# Patient Record
Sex: Female | Born: 2000 | Race: Black or African American | Hispanic: No | Smoking: Never smoker
Health system: Southern US, Community
[De-identification: ages and names within clinical notes are randomized; demographics above are authoritative.]

## PROBLEM LIST (undated history)

## (undated) DIAGNOSIS — Z789 Other specified health status: Secondary | ICD-10-CM

## (undated) DIAGNOSIS — D649 Anemia, unspecified: Secondary | ICD-10-CM

## (undated) HISTORY — DX: Anemia, unspecified: D64.9

## (undated) HISTORY — DX: Other specified health status: Z78.9

## (undated) HISTORY — PX: NO PAST SURGERIES: SHX2092

---
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
## 2019-03-11 IMAGING — CR DG CHEST 2V
2 series · 2 of 2 positions shown · non-contrast
Comparison: None.

CLINICAL DATA: Acute onset of intermittent generalized chest pain
and shortness of breath. Initial encounter.

EXAM:
CHEST  2 VIEW

[chest pa]
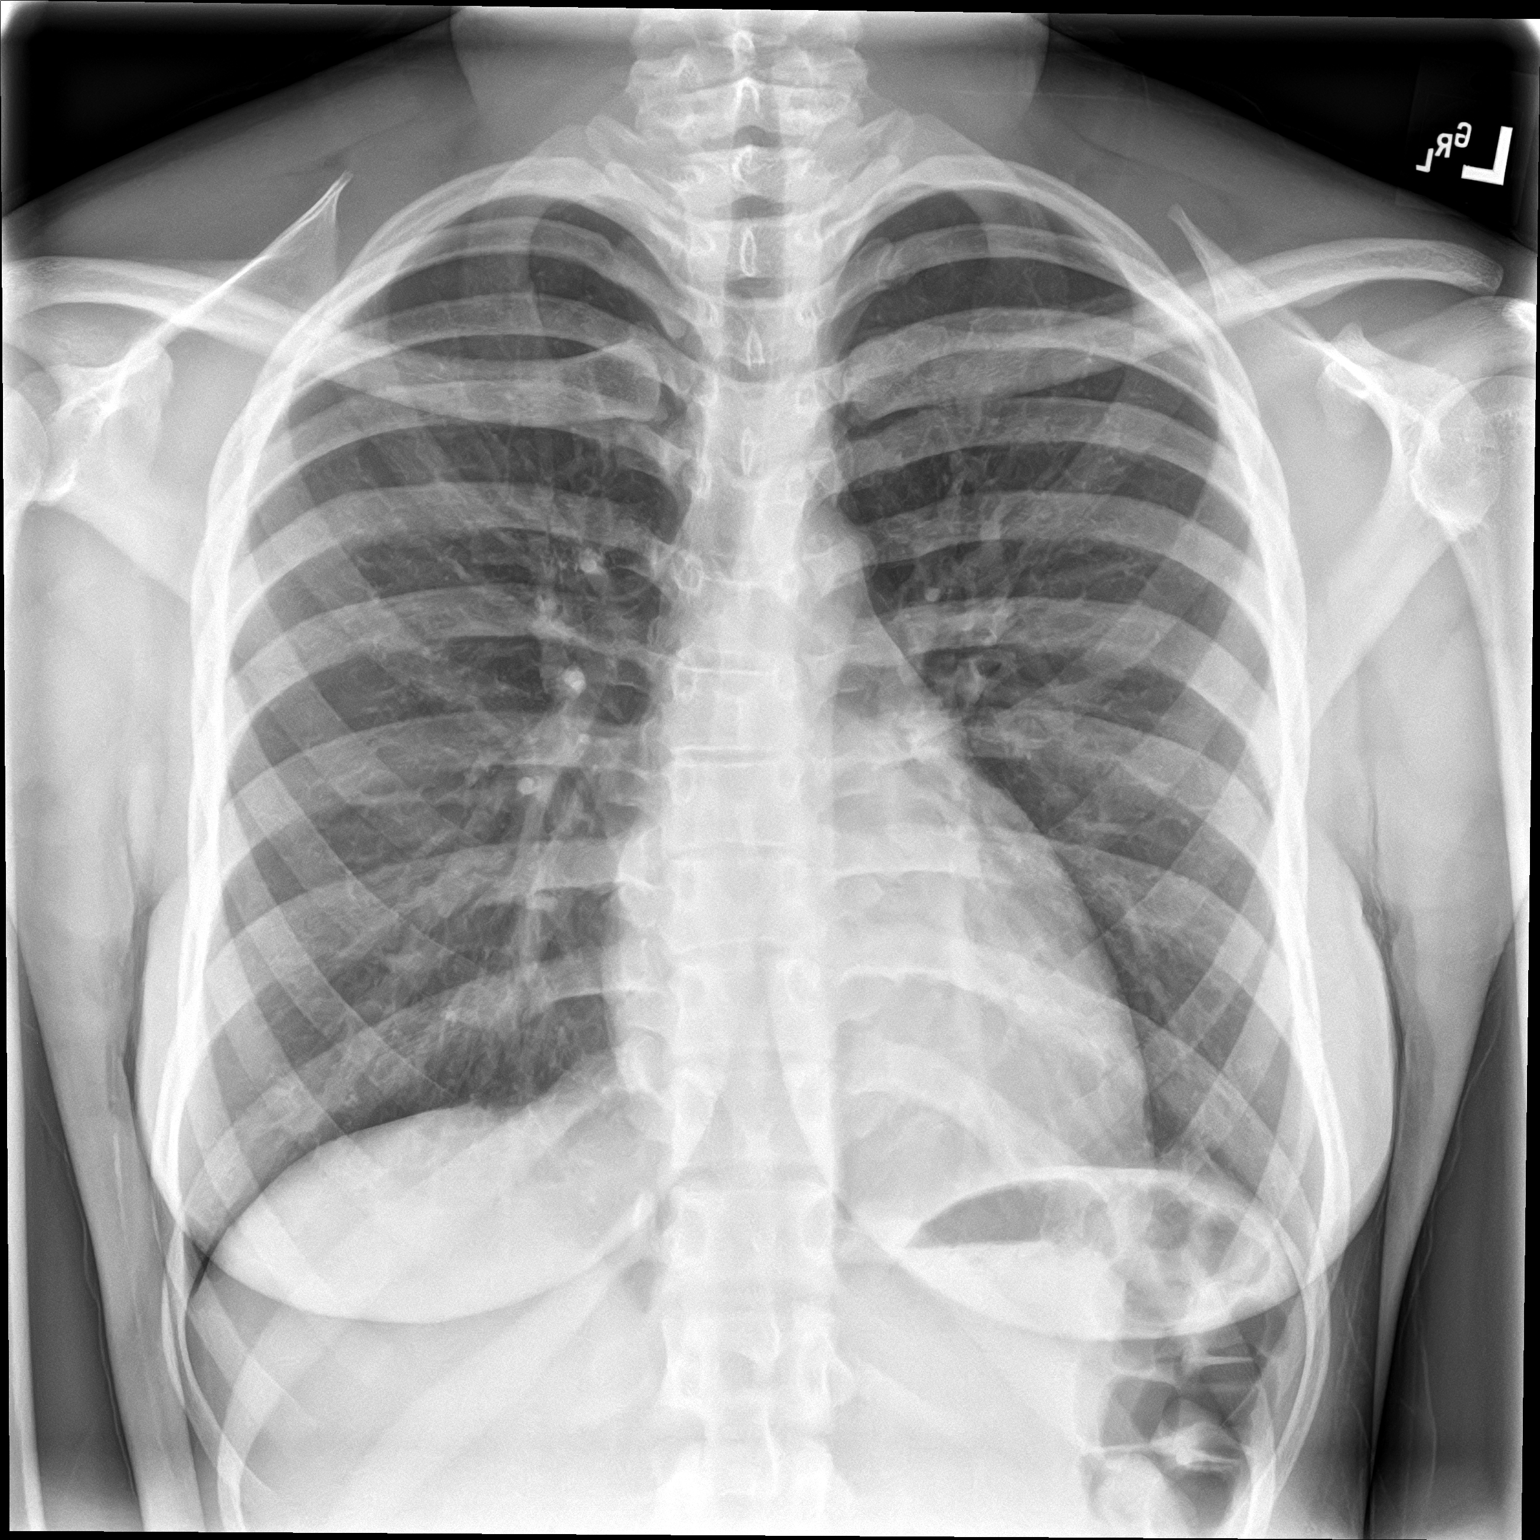

[chest lat]
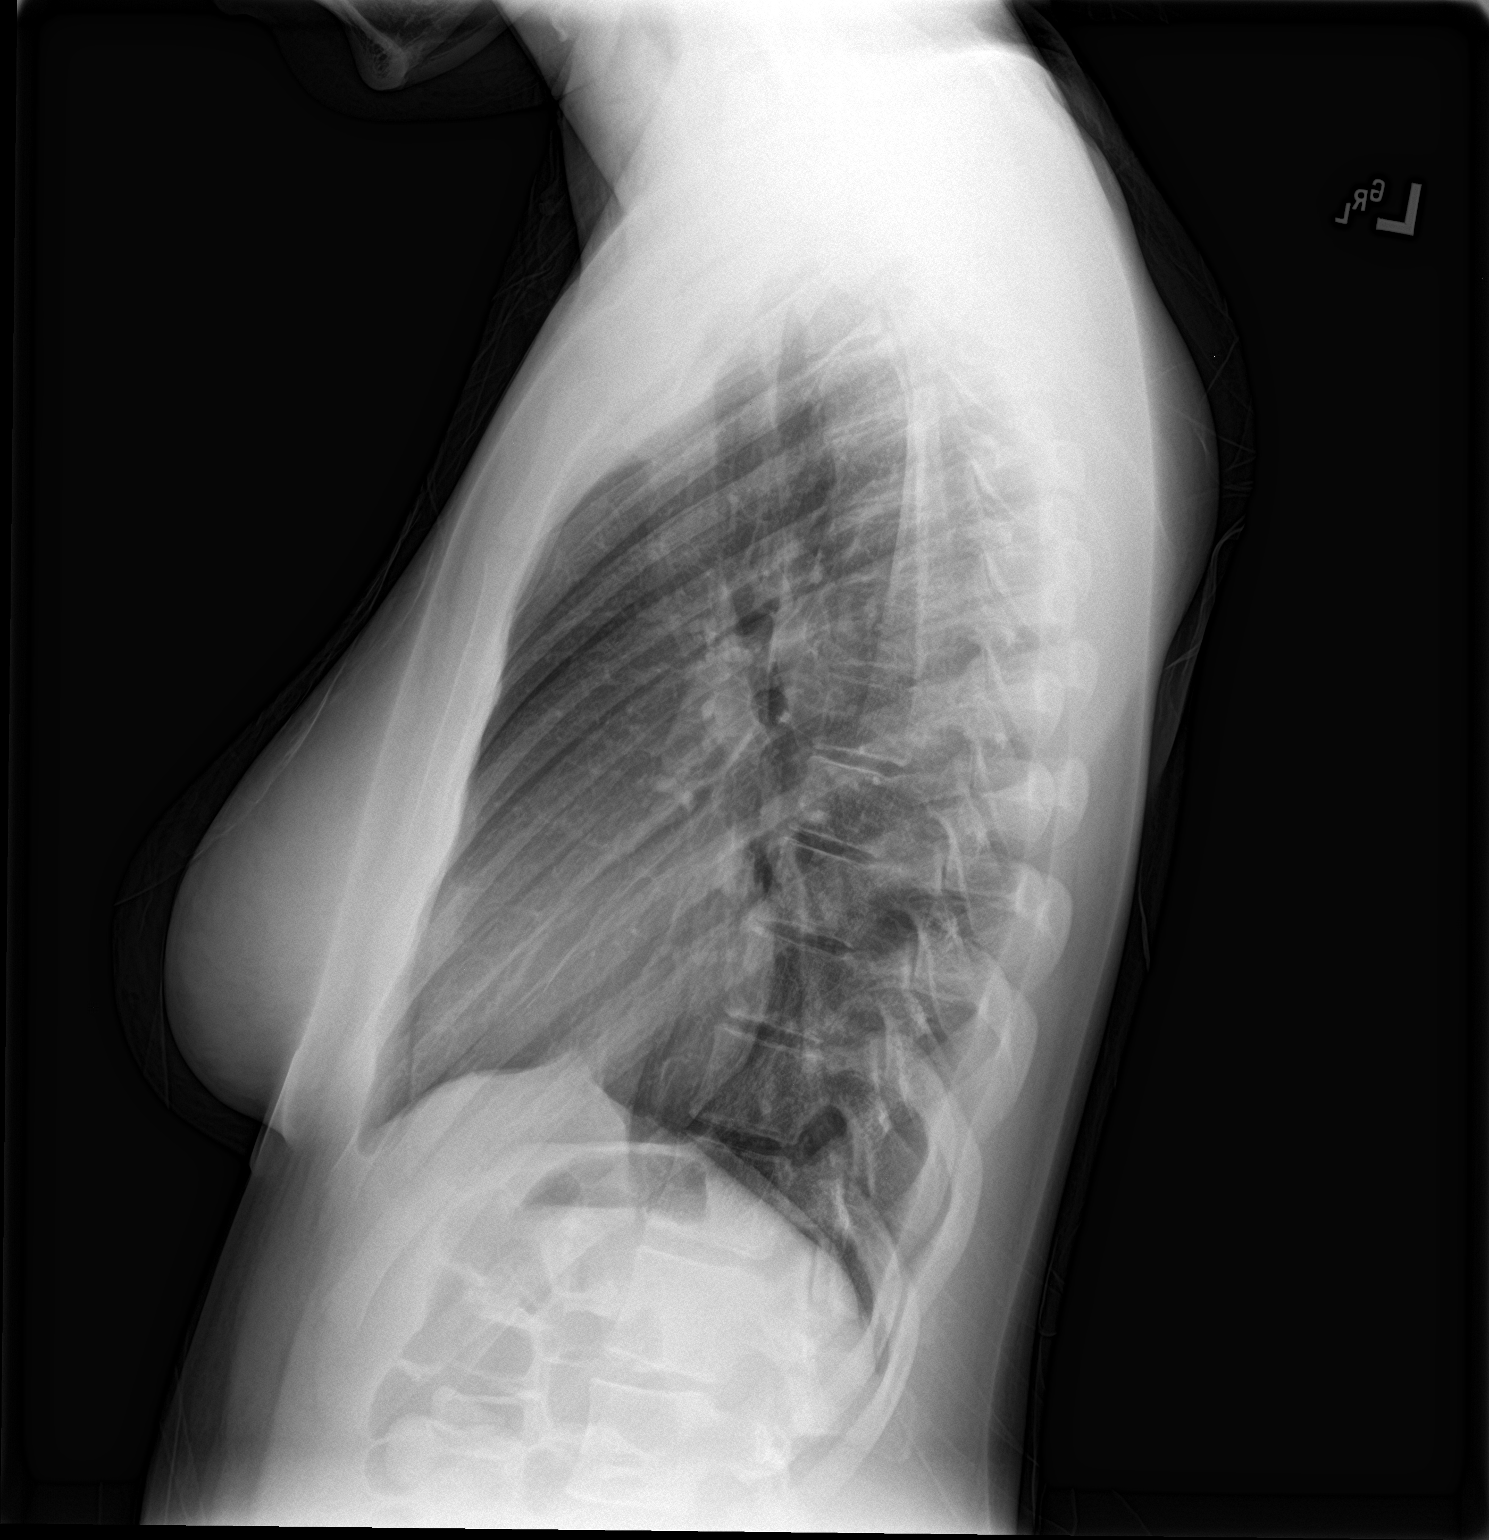

[2 of 2 positions shown; findings below may reference images not displayed]

FINDINGS: The lungs are well-aerated and clear. There is no evidence of focal
opacification, pleural effusion or pneumothorax.

The heart is normal in size; the mediastinal contour is within
normal limits. No acute osseous abnormalities are seen.
IMPRESSION: No acute cardiopulmonary process seen.

## 2019-03-20 ENCOUNTER — Other Ambulatory Visit
Admission: RE | Admit: 2019-03-20 | Discharge: 2019-03-20 | Disposition: A | Discharge status: Home / Self Care | Payer: Medicaid Other | Source: Ambulatory Visit | Attending: Family Medicine | Admitting: Family Medicine

## 2019-03-20 DIAGNOSIS — D649 Anemia, unspecified: Secondary | ICD-10-CM | POA: Diagnosis not present

## 2019-03-20 LAB — CBC
HCT: 30.3 % — ABNORMAL LOW (ref 36.0–46.0)
Hemoglobin: 9.1 g/dL — ABNORMAL LOW (ref 12.0–15.0)
MCH: 24.7 pg — ABNORMAL LOW (ref 26.0–34.0)
MCHC: 30 g/dL (ref 30.0–36.0)
MCV: 82.1 fL (ref 80.0–100.0)
Platelets: 293 10*3/uL (ref 150–400)
RBC: 3.69 MIL/uL — ABNORMAL LOW (ref 3.87–5.11)
RDW: 15.4 % (ref 11.5–15.5)
WBC: 4.9 10*3/uL (ref 4.0–10.5)
nRBC: 0 % (ref 0.0–0.2)

## 2019-03-20 LAB — IRON AND TIBC
Iron: 13 ug/dL — ABNORMAL LOW (ref 28–170)
Saturation Ratios: 3 % — ABNORMAL LOW (ref 10.4–31.8)
TIBC: 439 ug/dL (ref 250–450)
UIBC: 426 ug/dL

## 2020-03-24 NOTE — Patient Instructions (Signed)
I value your feedback and entrusting us with your care. If you get a [facility] patient survey, I would appreciate you taking the time to let us know about your experience today. Thank you!  As of November 08, 2019, your lab results will be released to your MyChart immediately, before I even have a chance to see them. Please give me time to review them and contact you if there are any abnormalities. Thank you for your patience.  

## 2020-03-24 NOTE — Progress Notes (Signed)
PCP:  Mathews Robinsons, MD   Chief Complaint  Patient presents with  . Gynecologic Exam     HPI:      [name] is a 19 y.o. G0P0000 who LMP was Patient's last menstrual period was 02/29/2020 (exact date)., presents today for her annual examination.  Her menses are regular every 28-30 days, lasting 5-7 days.  Dysmenorrhea mild, occurring first 1-2 days of flow. She does not have intermenstrual bleeding.  Sex activity: not current active, has been in past. Did xulane in past. Declines BC for now. Last Pap: N/A due to age Hx of STDs: none; would like full STD testing  There is no FH of breast cancer. There is no FH of ovarian cancer. The patient does not do self-breast exams.  Tobacco use: The patient denies current or previous tobacco use. Alcohol use: none No drug use.  Exercise: moderately active  She does not get adequate calcium and Vitamin D in her diet. Gardasil not done  History reviewed. No pertinent past medical history.  History reviewed. No pertinent surgical history.  Family History  Problem Relation Age of Onset  . Cancer Maternal Grandfather   . Breast cancer Neg Hx   . Ovarian cancer Neg Hx     Social History   Socioeconomic History  . Marital status: Single    Spouse name: Not on file  . Number of children: Not on file  . Years of education: Not on file  . Highest education level: Not on file  Occupational History  . Not on file  Tobacco Use  . Smoking status: Never Smoker  . Smokeless tobacco: Never Used  Substance and Sexual Activity  . Alcohol use: No  . Drug use: No  . Sexual activity: Yes    Birth control/protection: None  Other Topics Concern  . Not on file  Social History Narrative  . Not on file   Social Determinants of Health   Financial Resource Strain:   . Difficulty of Paying Living Expenses:   Food Insecurity:   . Worried About Charity fundraiser in the Last Year:   . Arboriculturist in the Last Year:    Transportation Needs:   . Film/video editor (Medical):   Marland Kitchen Lack of Transportation (Non-Medical):   Physical Activity:   . Days of Exercise per Week:   . Minutes of Exercise per Session:   Stress:   . Feeling of Stress :   Social Connections:   . Frequency of Communication with Friends and Family:   . Frequency of Social Gatherings with Friends and Family:   . Attends Religious Services:   . Active Member of Clubs or Organizations:   . Attends Archivist Meetings:   Marland Kitchen Marital Status:   Intimate Partner Violence:   . Fear of Current or Ex-Partner:   . Emotionally Abused:   Marland Kitchen Physically Abused:   . Sexually Abused:     No current outpatient medications on file.     ROS:  Review of Systems  Constitutional: Negative for fatigue, fever and unexpected weight change.  Respiratory: Negative for cough, shortness of breath and wheezing.   Cardiovascular: Negative for chest pain, palpitations and leg swelling.  Gastrointestinal: Negative for blood in stool, constipation, diarrhea, nausea and vomiting.  Endocrine: Negative for cold intolerance, heat intolerance and polyuria.  Genitourinary: Negative for dyspareunia, dysuria, flank pain, frequency, genital sores, hematuria, menstrual problem, pelvic pain, urgency, vaginal bleeding, vaginal discharge and  vaginal pain.  Musculoskeletal: Negative for back pain, joint swelling and myalgias.  Skin: Negative for rash.  Neurological: Negative for dizziness, syncope, light-headedness, numbness and headaches.  Hematological: Negative for adenopathy.  Psychiatric/Behavioral: Negative for agitation, confusion, sleep disturbance and suicidal ideas. The patient is not nervous/anxious.   BREAST: No symptoms   Objective: BP 110/70   Ht 5\' 3"  (1.6 m)   Wt 143 lb (64.9 kg)   LMP 02/29/2020 (Exact Date)   BMI 25.33 kg/m    Physical Exam Constitutional:      Appearance: She is well-developed.  Genitourinary:     Vulva,  vagina, cervix, uterus, right adnexa and left adnexa normal.     No vulval lesion or tenderness noted.     No vaginal discharge, erythema or tenderness.     No cervical polyp.     Uterus is not enlarged or tender.     No right or left adnexal mass present.     Right adnexa not tender.     Left adnexa not tender.  Neck:     Thyroid: No thyromegaly.  Cardiovascular:     Rate and Rhythm: Normal rate and regular rhythm.     Heart sounds: Normal heart sounds. No murmur.  Pulmonary:     Effort: Pulmonary effort is normal.     Breath sounds: Normal breath sounds.  Chest:     Breasts:        Right: No mass, nipple discharge, skin change or tenderness.        Left: No mass, nipple discharge, skin change or tenderness.  Abdominal:     Palpations: Abdomen is soft.     Tenderness: There is no abdominal tenderness. There is no guarding.  Musculoskeletal:        General: Normal range of motion.     Cervical back: Normal range of motion.  Neurological:     General: No focal deficit present.     Mental Status: She is alert and oriented to person, place, and time.     Cranial Nerves: No cranial nerve deficit.  Skin:    General: Skin is warm and dry.  Psychiatric:        Mood and Affect: Mood normal.        Behavior: Behavior normal.        Thought Content: Thought content normal.        Judgment: Judgment normal.  Vitals reviewed.    Assessment/Plan: Encounter for annual routine gynecological examination  Screening for STD (sexually transmitted disease) - Plan: Cervicovaginal ancillary only, HIV Antibody (routine testing w rflx), RPR, HSV 2 antibody, IgG, Hepatitis C antibody            GYN counsel adequate intake of calcium and vitamin D, diet and exercise; Gardasil--f/u with PCP due to insurance     F/U  Return in about 1 year (around 03/25/2021).  Kaia Depaolis B. Awab Abebe, PA-C 03/25/2020 8:39 AM

## 2020-03-25 ENCOUNTER — Other Ambulatory Visit: Payer: Self-pay

## 2020-03-25 ENCOUNTER — Encounter: Payer: Self-pay | Admitting: Obstetrics and Gynecology

## 2020-03-25 ENCOUNTER — Other Ambulatory Visit (HOSPITAL_COMMUNITY)
Admission: RE | Admit: 2020-03-25 | Discharge: 2020-03-25 | Disposition: A | Discharge status: Home / Self Care | Payer: Medicaid Other | Source: Ambulatory Visit | Attending: Obstetrics and Gynecology | Admitting: Obstetrics and Gynecology

## 2020-03-25 ENCOUNTER — Ambulatory Visit (INDEPENDENT_AMBULATORY_CARE_PROVIDER_SITE_OTHER): Payer: Medicaid Other | Admitting: Obstetrics and Gynecology

## 2020-03-25 VITALS — BP 110/70 | Ht 63.0 in | Wt 143.0 lb

## 2020-03-25 DIAGNOSIS — Z113 Encounter for screening for infections with a predominantly sexual mode of transmission: Secondary | ICD-10-CM | POA: Diagnosis present

## 2020-03-25 DIAGNOSIS — Z01419 Encounter for gynecological examination (general) (routine) without abnormal findings: Secondary | ICD-10-CM | POA: Diagnosis not present

## 2020-03-25 DIAGNOSIS — Z0000 Encounter for general adult medical examination without abnormal findings: Secondary | ICD-10-CM

## 2020-03-26 LAB — CERVICOVAGINAL ANCILLARY ONLY
Chlamydia: NEGATIVE
Comment: NEGATIVE
Comment: NORMAL
Neisseria Gonorrhea: NEGATIVE

## 2020-03-26 LAB — RPR: RPR Ser Ql: NONREACTIVE

## 2020-03-26 LAB — HEPATITIS C ANTIBODY: Hep C Virus Ab: <0.1 s/co ratio (ref 0.0–0.9)

## 2020-03-26 LAB — HIV ANTIBODY (ROUTINE TESTING W REFLEX): HIV Screen 4th Generation wRfx: NONREACTIVE

## 2020-03-26 LAB — HSV 2 ANTIBODY, IGG: HSV 2 IgG, Type Spec: <0.91 index (ref 0.00–0.90)

## 2020-06-07 ENCOUNTER — Emergency Department
Admission: EM | Admit: 2020-06-07 | Discharge: 2020-06-07 | Disposition: A | Discharge status: Home / Self Care | Payer: Medicaid Other | Attending: Emergency Medicine | Admitting: Emergency Medicine

## 2020-06-07 ENCOUNTER — Other Ambulatory Visit: Payer: Self-pay

## 2020-06-07 ENCOUNTER — Encounter: Payer: Self-pay | Admitting: Emergency Medicine

## 2020-06-07 DIAGNOSIS — N762 Acute vulvitis: Secondary | ICD-10-CM | POA: Diagnosis not present

## 2020-06-07 DIAGNOSIS — N760 Acute vaginitis: Secondary | ICD-10-CM

## 2020-06-07 DIAGNOSIS — N764 Abscess of vulva: Secondary | ICD-10-CM | POA: Diagnosis present

## 2020-06-07 MED ORDER — SULFAMETHOXAZOLE-TRIMETHOPRIM 800-160 MG PO TABS: 1.0000 | ORAL_TABLET | Freq: Two times a day (BID) | ORAL | 0 refills | Status: DC

## 2020-06-07 MED ORDER — HYDROCODONE-ACETAMINOPHEN 5-325 MG PO TABS: 1.0000 | ORAL_TABLET | Freq: Four times a day (QID) | ORAL | 0 refills | Status: DC | PRN

## 2020-06-07 NOTE — ED Provider Notes (Signed)
[facility] Emergency Department Provider Note   ____________________________________________   First MD Initiated Contact with Patient 06/07/20 1226     (approximate)  I have reviewed the triage vital signs and the nursing notes.   HISTORY  Chief Complaint Abscess    HPI Kristi Herrera is a 19 y.o. female presents to the ED with possible abscess to the vaginal area.  Patient states that she noticed some swelling to her labia couple days ago.  She denies any vaginal discharge or urinary symptoms.  Patient does use a razor to shave in this area but denies any skin injuries.  Currently she rates her pain as 7 out of 10.      History reviewed. No pertinent past medical history.  There are no problems to display for this patient.   History reviewed. No pertinent surgical history.  Prior to Admission medications   Medication Sig Start Date End Date Taking? Authorizing Provider  HYDROcodone-acetaminophen (NORCO/VICODIN) 5-325 MG tablet Take 1 tablet by mouth every 6 (six) hours as needed for moderate pain. 06/07/20   Tommi Rumps, PA-C  sulfamethoxazole-trimethoprim (BACTRIM DS) 800-160 MG tablet Take 1 tablet by mouth 2 (two) times daily. 06/07/20   Tommi Rumps, PA-C    Allergies Patient has no known allergies.  Family History  Problem Relation Age of Onset  . Cancer Maternal Grandfather   . Breast cancer Neg Hx   . Ovarian cancer Neg Hx     Social History Social History   Tobacco Use  . Smoking status: Never Smoker  . Smokeless tobacco: Never Used  Vaping Use  . Vaping Use: Never used  Substance Use Topics  . Alcohol use: No  . Drug use: Not on file    Review of Systems Constitutional: No fever/chills Cardiovascular: Denies chest pain. Respiratory: Denies shortness of breath. Gastrointestinal: No abdominal pain.  No nausea, no vomiting.  Genitourinary: Negative for dysuria.  Positive possible vaginal  abscess. Musculoskeletal: Negative for back pain. Skin: Negative for rash. Neurological: Negative for headaches, focal weakness or numbness. ____________________________________________   PHYSICAL EXAM:  VITAL SIGNS: ED Triage Vitals  Enc Vitals Group     BP 06/07/20 1221 128/72     Pulse Rate 06/07/20 1221 67     Resp 06/07/20 1221 18     Temp 06/07/20 1221 97.6 F (36.4 C)     Temp Source 06/07/20 1221 Oral     SpO2 06/07/20 1221 100 %     Weight 06/07/20 1222 140 lb (63.5 kg)     Height 06/07/20 1222 5\' 3"  (1.6 m)     Head Circumference --      Peak Flow --      Pain Score 06/07/20 1222 7     Pain Loc --      Pain Edu? --      Excl. in GC? --     Constitutional: Alert and oriented. Well appearing and in no acute distress. Eyes: Conjunctivae are normal.  Head: Atraumatic. Neck: No stridor.   Cardiovascular: Normal rate, regular rhythm. Grossly normal heart sounds.  Good peripheral circulation. Respiratory: Normal respiratory effort.  No retractions. Lungs CTAB. Gastrointestinal: Soft and nontender. No distention. Genitourinary: Examination of vaginal area does shave in this area but no cuts or abrasions were noted.  There is on the labia majora left side distally a area of mild erythema with no localized abscess at this time.  Area is tender to palpation.  No vaginal discharge noted.  Possible cellulitis secondary to shaving versus early abscess. Musculoskeletal: Patient is able to ambulate without any assistance. Neurologic:  Normal speech and language. No gross focal neurologic deficits are appreciated. No gait instability. Skin:  Skin is warm, dry and intact. No rash noted. Psychiatric: Mood and affect are normal. Speech and behavior are normal.  ____________________________________________   LABS (all labs ordered are listed, but only abnormal results are displayed)  Labs Reviewed - No data to display   PROCEDURES  Procedure(s) performed (including Critical  Care):  Procedures   ____________________________________________   INITIAL IMPRESSION / ASSESSMENT AND PLAN / ED COURSE  As part of my medical decision making, I reviewed the following data within the electronic MEDICAL RECORD NUMBER Notes from prior ED visits and [place] Controlled Substance Database  19 year old female presents to the ED with possible abscess to her vaginal area.  Patient states this started 2 days ago and is very tender.  She admits that she has been shaving in this area but is unaware of any cuts or abrasions.  Area on the left labia majora appears to be a cellulitis at this time with tenderness and no localized abscess.  Patient is instructed to begin using warm compresses or sitz bath's.  She is started on Bactrim DS twice daily for 10 days and hydrocodone as needed for pain.  She is aware to return to the emergency department if any worsening of her symptoms such as fever, chills, enlargement of this area.  ____________________________________________   FINAL CLINICAL IMPRESSION(S) / ED DIAGNOSES  Final diagnoses:  Labial infection     ED Discharge Orders         Ordered    sulfamethoxazole-trimethoprim (BACTRIM DS) 800-160 MG tablet  2 times daily     Discontinue  Reprint     06/07/20 1306    HYDROcodone-acetaminophen (NORCO/VICODIN) 5-325 MG tablet  Every 6 hours PRN     Discontinue  Reprint     06/07/20 1306           Note:  This document was prepared using Dragon voice recognition software and may include unintentional dictation errors.    Tommi Rumps, PA-C 06/07/20 1346    Arnaldo Natal, MD 06/07/20 1540

## 2020-06-07 NOTE — ED Triage Notes (Signed)
Presents with possible abscess to vaginal area  States she noticed some swelling to labia couple of days ago

## 2020-06-07 NOTE — Discharge Instructions (Signed)
Follow-up with your primary care provider if any continued problems.  If any worsening of your symptoms return to the emergency department.  At this time there is definite skin infection however there is no abscess to be drained.  Begin using warm moist compresses to the area frequently or sits baths.  This still can develop into an abscess and need to be opened.  Also discontinue using the razor that you have been using and began using a new one once this area has completely cleared.  A prescription for pain medication and antibiotics was sent to your pharmacy.  Take the antibiotic until completely finished.  Pain medication as needed for pain.  Do not drive or operate machinery while taking this medication.

## 2020-06-13 ENCOUNTER — Ambulatory Visit: Payer: Self-pay | Admitting: Obstetrics and Gynecology

## 2020-06-13 ENCOUNTER — Ambulatory Visit (INDEPENDENT_AMBULATORY_CARE_PROVIDER_SITE_OTHER): Payer: Medicaid Other | Admitting: Obstetrics and Gynecology

## 2020-06-13 ENCOUNTER — Other Ambulatory Visit (HOSPITAL_COMMUNITY)
Admission: RE | Admit: 2020-06-13 | Discharge: 2020-06-13 | Disposition: A | Discharge status: Home / Self Care | Payer: Medicaid Other | Source: Ambulatory Visit | Attending: Obstetrics and Gynecology | Admitting: Obstetrics and Gynecology

## 2020-06-13 ENCOUNTER — Encounter: Payer: Self-pay | Admitting: Obstetrics and Gynecology

## 2020-06-13 ENCOUNTER — Other Ambulatory Visit: Payer: Self-pay

## 2020-06-13 VITALS — BP 118/74 | Ht 63.0 in | Wt 142.0 lb

## 2020-06-13 DIAGNOSIS — N751 Abscess of Bartholin's gland: Secondary | ICD-10-CM

## 2020-06-13 DIAGNOSIS — Z113 Encounter for screening for infections with a predominantly sexual mode of transmission: Secondary | ICD-10-CM

## 2020-06-13 NOTE — Progress Notes (Signed)
Obstetrics & Gynecology Office Visit   Chief Complaint  Patient presents with  . Vaginal Pain  Follow up abscess  History of Present Illness: 19 y.o. G0P0000 female who presents to have a possible labial abscess assessed.  She noted pain on her left labial area about 6 days ago. She had difficulty sitting down. She is not sure what could have caused it. She was told she had cellulitis but no overt abscess in the ER. She has seen her pediatrician at [facility], who switched her from Bactrim, which she was given in the ER, to doxycycline.  She had some but not complete resolution of the pain with Bactrim. She started doxycycline yesterday.  She believes her pain is improved, but not gone. Denies history of STDs. She is with a new partner and is OK with STD testing.   Past Medical History:  Diagnosis Date  . No known health problems     Past Surgical History:  Procedure Laterality Date  . NO PAST SURGERIES      Gynecologic History: Patient's last menstrual period was 05/17/2020 (exact date).  Obstetric History: G0P0000  Family History  Problem Relation Age of Onset  . Cancer Maternal Grandfather   . Breast cancer Neg Hx   . Ovarian cancer Neg Hx     Social History   Socioeconomic History  . Marital status: Single    Spouse name: Not on file  . Number of children: Not on file  . Years of education: Not on file  . Highest education level: Not on file  Occupational History  . Not on file  Tobacco Use  . Smoking status: Never Smoker  . Smokeless tobacco: Never Used  Vaping Use  . Vaping Use: Never used  Substance and Sexual Activity  . Alcohol use: No  . Drug use: Not on file  . Sexual activity: Yes    Birth control/protection: None  Other Topics Concern  . Not on file  Social History Narrative  . Not on file   Social Determinants of Health   Financial Resource Strain:   . Difficulty of Paying Living Expenses:   Food Insecurity:   . Worried About Community education officer in the Last Year:   . Barista in the Last Year:   Transportation Needs:   . Freight forwarder (Medical):   Marland Kitchen Lack of Transportation (Non-Medical):   Physical Activity:   . Days of Exercise per Week:   . Minutes of Exercise per Session:   Stress:   . Feeling of Stress :   Social Connections:   . Frequency of Communication with Friends and Family:   . Frequency of Social Gatherings with Friends and Family:   . Attends Religious Services:   . Active Member of Clubs or Organizations:   . Attends Banker Meetings:   Marland Kitchen Marital Status:   Intimate Partner Violence:   . Fear of Current or Ex-Partner:   . Emotionally Abused:   Marland Kitchen Physically Abused:   . Sexually Abused:     No Known Allergies  Prior to Admission medications   Medication Sig Start Date End Date Taking? Authorizing Provider  HYDROcodone-acetaminophen (NORCO/VICODIN) 5-325 MG tablet Take 1 tablet by mouth every 6 (six) hours as needed for moderate pain. 06/07/20   Tommi Rumps, PA-C  Doxycycline 100 mg po BID x 14 days  Review of Systems  Constitutional: Negative.   HENT: Negative.   Eyes: Negative.   Respiratory:  Negative.   Cardiovascular: Negative.   Gastrointestinal: Negative.   Genitourinary: Negative.   Musculoskeletal: Negative.   Skin: Negative.   Neurological: Negative.   Psychiatric/Behavioral: Negative.      Physical Exam BP 118/74   Ht 5\' 3"  (1.6 m)   Wt 142 lb (64.4 kg)   LMP 05/17/2020 (Exact Date)   BMI 25.15 kg/m  Patient's last menstrual period was 05/17/2020 (exact date). Physical Exam Constitutional:      General: She is not in acute distress.    Appearance: Normal appearance. She is well-developed.  Genitourinary:     Pelvic exam was performed with patient in the lithotomy position.     Inguinal canal, urethra and bladder normal.     Vulval lesion, tenderness and Bartholin's cyst (left abscess 3x3x2.5 cm) present.     HENT:     Head:  Normocephalic and atraumatic.  Eyes:     General: No scleral icterus.    Conjunctiva/sclera: Conjunctivae normal.  Cardiovascular:     Rate and Rhythm: Normal rate and regular rhythm.     Heart sounds: No murmur heard.  No friction rub. No gallop.   Pulmonary:     Effort: Pulmonary effort is normal. No respiratory distress.     Breath sounds: Normal breath sounds. No wheezing or rales.  Abdominal:     General: Bowel sounds are normal. There is no distension.     Palpations: Abdomen is soft. There is no mass.     Tenderness: There is no abdominal tenderness. There is no guarding or rebound.  Musculoskeletal:        General: Normal range of motion.     Cervical back: Normal range of motion and neck supple.  Neurological:     General: No focal deficit present.     Mental Status: She is alert and oriented to person, place, and time.     Cranial Nerves: No cranial nerve deficit.  Skin:    General: Skin is warm and dry.     Findings: No erythema.  Psychiatric:        Mood and Affect: Mood normal.        Behavior: Behavior normal.        Judgment: Judgment normal.     Female chaperone present for pelvic and breast  portions of the physical exam  Assessment: 19 y.o. G0P0000 female here for  1. Bartholin's gland abscess   2. Screen for STD (sexually transmitted disease)      Plan: Problem List Items Addressed This Visit    None    Visit Diagnoses    Bartholin's gland abscess    -  Primary   Relevant Orders   Cervicovaginal ancillary only   Screen for STD (sexually transmitted disease)       Relevant Orders   Cervicovaginal ancillary only     We discussed the natural history of Bartholin's gland abscesses.  Treatment options include monitoring to see if opens and drains without intervention.  I would recommend continuation of antibiotics in this case.  Bactrim is a typical good first-line antibiotic for this abscess.  However, she only benefited minimally from Bactrim.  I  believe doxycycline would be fine for 2-week course at this point.  Other options offered were an incision and drainage of the abscess, incision and drainage with a Ward catheter, marsupialization in the operating room.  She elects to see how she responds to the antibiotics and if she still has the lesion present in a couple  of weeks she will call to schedule a marsupialization.  A total of 20 minutes were spent face-to-face with the patient as well as preparation, review, communication, and documentation during this encounter.    Thomasene Mohair, MD 06/13/2020 5:20 PM

## 2020-06-18 LAB — CERVICOVAGINAL ANCILLARY ONLY
Chlamydia: NEGATIVE
Comment: NEGATIVE
Comment: NEGATIVE
Comment: NORMAL
Neisseria Gonorrhea: NEGATIVE
Trichomonas: NEGATIVE

## 2020-06-19 ENCOUNTER — Telehealth: Payer: Self-pay

## 2020-06-19 NOTE — Telephone Encounter (Signed)
Please let patient know that we can possibly get her in for I&D or for marsupialization next week.  Just let her know I'm not around next week.

## 2020-06-19 NOTE — Telephone Encounter (Signed)
Pt calling to let SD[facility] know her abscess is not getting better.  [phone]

## 2020-06-20 ENCOUNTER — Other Ambulatory Visit: Payer: Self-pay

## 2020-06-20 ENCOUNTER — Emergency Department
Admission: EM | Admit: 2020-06-20 | Discharge: 2020-06-20 | Disposition: A | Discharge status: Home / Self Care | Payer: Medicaid Other | Attending: Emergency Medicine | Admitting: Emergency Medicine

## 2020-06-20 DIAGNOSIS — N751 Abscess of Bartholin's gland: Secondary | ICD-10-CM | POA: Insufficient documentation

## 2020-06-20 LAB — CBC WITH DIFFERENTIAL/PLATELET
Abs Immature Granulocytes: 0.1 10*3/uL — ABNORMAL HIGH (ref 0.00–0.07)
Basophils Absolute: 0 10*3/uL (ref 0.0–0.1)
Basophils Relative: 0 %
Eosinophils Absolute: 0.1 10*3/uL (ref 0.0–0.5)
Eosinophils Relative: 1 %
HCT: 31.1 % — ABNORMAL LOW (ref 36.0–46.0)
Hemoglobin: 10 g/dL — ABNORMAL LOW (ref 12.0–15.0)
Immature Granulocytes: 1 %
Lymphocytes Relative: 19 %
Lymphs Abs: 1.9 10*3/uL (ref 0.7–4.0)
MCH: 26.2 pg (ref 26.0–34.0)
MCHC: 32.2 g/dL (ref 30.0–36.0)
MCV: 81.4 fL (ref 80.0–100.0)
Monocytes Absolute: 0.6 10*3/uL (ref 0.1–1.0)
Monocytes Relative: 6 %
Neutro Abs: 7.3 10*3/uL (ref 1.7–7.7)
Neutrophils Relative %: 73 %
Platelets: 370 10*3/uL (ref 150–400)
RBC: 3.82 MIL/uL — ABNORMAL LOW (ref 3.87–5.11)
RDW: 15.4 % (ref 11.5–15.5)
WBC: 10.1 10*3/uL (ref 4.0–10.5)
nRBC: 0 % (ref 0.0–0.2)

## 2020-06-20 LAB — URINALYSIS, COMPLETE (UACMP) WITH MICROSCOPIC
Bilirubin Urine: NEGATIVE
Glucose, UA: NEGATIVE mg/dL
Hgb urine dipstick: NEGATIVE
Ketones, ur: NEGATIVE mg/dL
Leukocytes,Ua: NEGATIVE
Nitrite: NEGATIVE
Protein, ur: NEGATIVE mg/dL
Specific Gravity, Urine: 1.021 (ref 1.005–1.030)
pH: 7 (ref 5.0–8.0)

## 2020-06-20 LAB — COMPREHENSIVE METABOLIC PANEL
ALT: 9 U/L (ref 0–44)
AST: 17 U/L (ref 15–41)
Albumin: 3.9 g/dL (ref 3.5–5.0)
Alkaline Phosphatase: 51 U/L (ref 38–126)
Anion gap: 8 (ref 5–15)
BUN: 15 mg/dL (ref 6–20)
CO2: 24 mmol/L (ref 22–32)
Calcium: 8.5 mg/dL — ABNORMAL LOW (ref 8.9–10.3)
Chloride: 107 mmol/L (ref 98–111)
Creatinine, Ser: 1.04 mg/dL — ABNORMAL HIGH (ref 0.44–1.00)
GFR calc Af Amer: >60 mL/min (ref >60)
GFR calc non Af Amer: >60 mL/min (ref >60)
Glucose, Bld: 98 mg/dL (ref 70–99)
Potassium: 3.9 mmol/L (ref 3.5–5.1)
Sodium: 139 mmol/L (ref 135–145)
Total Bilirubin: 0.6 mg/dL (ref 0.3–1.2)
Total Protein: 7.6 g/dL (ref 6.5–8.1)

## 2020-06-20 LAB — PREGNANCY, URINE: Preg Test, Ur: NEGATIVE

## 2020-06-20 MED ORDER — ONDANSETRON HCL 4 MG/2ML IJ SOLN
4.0000 mg | Freq: Once | INTRAMUSCULAR | Status: AC
  Administered 2020-06-20: 4 mg via INTRAVENOUS
  Filled 2020-06-20: qty 2

## 2020-06-20 MED ORDER — HYDROCODONE-ACETAMINOPHEN 5-325 MG PO TABS: 1.0000 | ORAL_TABLET | Freq: Four times a day (QID) | ORAL | 0 refills | Status: AC | PRN

## 2020-06-20 MED ORDER — FENTANYL CITRATE (PF) 100 MCG/2ML IJ SOLN
50.0000 ug | Freq: Once | INTRAMUSCULAR | Status: AC
  Administered 2020-06-20: 50 ug via INTRAVENOUS
  Filled 2020-06-20: qty 2

## 2020-06-20 MED ORDER — SODIUM CHLORIDE 0.9 % IV BOLUS
1000.0000 mL | Freq: Once | INTRAVENOUS | Status: AC
  Administered 2020-06-20: 1000 mL via INTRAVENOUS

## 2020-06-20 MED ORDER — LIDOCAINE HCL (PF) 1 % IJ SOLN
5.0000 mL | Freq: Once | INTRAMUSCULAR | Status: AC
  Administered 2020-06-20: 5 mL via INTRADERMAL
  Filled 2020-06-20: qty 5

## 2020-06-20 MED ORDER — LIDOCAINE-PRILOCAINE 2.5-2.5 % EX CREA
TOPICAL_CREAM | Freq: Once | CUTANEOUS | Status: AC
  Filled 2020-06-20: qty 5

## 2020-06-20 NOTE — Discharge Instructions (Signed)
If the drainage has stopped, remove the packing in 2 days. If it is still draining, return to the ER for a wound check.  Follow up with [facility] for concerns.

## 2020-06-20 NOTE — ED Notes (Signed)
See triage note  Presents with possible abscess area to labia  States she was seen and placed on antibiotics twice for same  States area has gotten larger  No fever

## 2020-06-20 NOTE — ED Provider Notes (Signed)
[facility] Emergency Department Provider Note  ____________________________________________  Time seen: Approximately 7:33 AM  I have reviewed the triage vital signs and the nursing notes.   HISTORY  Chief Complaint Abscess    HPI Kristi Herrera is a 19 y.o. female who presents to the emergency department for treatment and evaluation of labial abscess. She first noticed a small, tender bump on 7/8/20201. She was evaluated here on 06/07/2020 then followed up outpatient on 06/13/2020 with Dr. Jean Rosenthal with [facility]. No I&D performed. She was advised that if it did not drain on it's own that she would "need surgery." Area has increased in size and pain is worse despite Doxycycline. Area has not drained. She denies fever or other symptoms of concern.   Past Medical History:  Diagnosis Date   No known health problems     There are no problems to display for this patient.   Past Surgical History:  Procedure Laterality Date   NO PAST SURGERIES      Prior to Admission medications   Medication Sig Start Date End Date Taking? Authorizing Provider  HYDROcodone-acetaminophen (NORCO/VICODIN) 5-325 MG tablet Take 1 tablet by mouth every 6 (six) hours as needed for up to 3 days for severe pain. 06/20/20 06/23/20  Meilyn Heindl, Rulon Eisenmenger B, FNP  sulfamethoxazole-trimethoprim (BACTRIM DS) 800-160 MG tablet Take 1 tablet by mouth 2 (two) times daily. 06/07/20   Tommi Rumps, PA-C    Allergies Patient has no known allergies.  Family History  Problem Relation Age of Onset   Cancer Maternal Grandfather    Breast cancer Neg Hx    Ovarian cancer Neg Hx     Social History Social History   Tobacco Use   Smoking status: Never Smoker   Smokeless tobacco: Never Used  Building services engineer Use: Never used  Substance Use Topics   Alcohol use: No   Drug use: Not on file    Review of Systems Constitutional: Negative for fever. Respiratory: Negative for shortness of  breath or cough. Gastrointestinal: Negataive for abdominal pain; negative for nausea , negative for vomiting. Genitourinary: Negative for dysuria , Negative for vaginal discharge. Musculoskeletal: Negative for back pain. Skin: Negative for acute skin changes/rash/lesion. ____________________________________________   PHYSICAL EXAM:  VITAL SIGNS: ED Triage Vitals [06/20/20 0611]  Enc Vitals Group     BP 123/72     Pulse Rate 78     Resp 18     Temp 98.4 F (36.9 C)     Temp Source Oral     SpO2 100 %     Weight 140 lb (63.5 kg)     Height 5\' 3"  (1.6 m)     Head Circumference      Peak Flow      Pain Score 8     Pain Loc      Pain Edu?      Excl. in GC?     Constitutional: Alert and oriented. Well appearing and in no acute distress. Eyes: Conjunctivae are normal. Head: Atraumatic. Nose: No congestion/rhinnorhea. Mouth/Throat: Mucous membranes are moist. Respiratory: Normal respiratory effort.  No retractions. Gastrointestinal: Bowel sounds active x 4; Abdomen is soft without rebound or guarding. Genitourinary: Pelvic exam: Bartholin's cyst abscess left labia 4x3cm Musculoskeletal: No extremity tenderness nor edema.  Neurologic:  Normal speech and language. No gross focal neurologic deficits are appreciated. Speech is normal. No gait instability. Skin:  Skin is warm, dry and intact. No rash noted on exposed skin. Psychiatric: Mood  and affect are normal. Speech and behavior are normal.  ____________________________________________   LABS (all labs ordered are listed, but only abnormal results are displayed)  Labs Reviewed  COMPREHENSIVE METABOLIC PANEL - Abnormal; Notable for the following components:      Result Value   Creatinine, Ser 1.04 (*)    Calcium 8.5 (*)    All other components within normal limits  CBC WITH DIFFERENTIAL/PLATELET - Abnormal; Notable for the following components:   RBC 3.82 (*)    Hemoglobin 10.0 (*)    HCT 31.1 (*)    Abs Immature  Granulocytes 0.10 (*)    All other components within normal limits  URINALYSIS, COMPLETE (UACMP) WITH MICROSCOPIC - Abnormal; Notable for the following components:   Color, Urine YELLOW (*)    APPearance HAZY (*)    Bacteria, UA RARE (*)    All other components within normal limits  PREGNANCY, URINE   ____________________________________________  RADIOLOGY  Not indicated. ____________________________________________  .Marland KitchenIncision and Drainage  Date/Time: 06/20/2020 9:53 AM Performed by: Chinita Pester, FNP Authorized by: Chinita Pester, FNP   Consent:    Consent obtained:  Verbal   Consent given by:  Patient   Risks discussed:  Incomplete drainage and pain Location:    Type:  Bartholin cyst Pre-procedure details:    Skin preparation:  Betadine Anesthesia (see MAR for exact dosages):    Anesthesia method:  Topical application and local infiltration   Topical anesthetic:  EMLA cream   Local anesthetic:  Lidocaine 1% w/o epi Procedure type:    Complexity:  Complex Procedure details:    Incision types:  Single straight   Incision depth:  Submucosal   Scalpel blade:  11   Wound management:  Probed and deloculated   Drainage:  Bloody and purulent   Drainage amount:  Copious   Wound treatment:  Drain placed   Packing materials:  1/4 in iodoform gauze Post-procedure details:    Patient tolerance of procedure:  Tolerated well, no immediate complications   ____________________________________________  19 year old female presenting to the emergency department for treatment and evaluation of labial abscess. See HPI for further details. Plan will be to get some labs, give her some pain medication and IV fluids.  Note from Dr. Jean Rosenthal yesterday stated that the office will schedule her for our marsupialization next week. After labs have resulted, will touch base with whoever is on-call for [facility].  No leukocytosis, hemoglobin is 10.0 which is chronic and has improved  since last blood draw 1 year ago.  Urinalysis is negative for any acute concerns.  Pregnancy test is negative.  Case discussed with Dr. Jean Rosenthal who is on-call for [facility].  He recommends I&D in the emergency department if patient will consent.  Discussed risks and benefits of I&D in the emergency department with the patient and her mother.  Her mother reports that she herself has had 3 similar abscess that have been lanced in the emergency department with good results.  Patient feels reassured and agrees to I&D here today.  Incision and drainage performed as above.  Patient tolerated the procedure very well.  She is to remove the packing in 2 to 3 days if the drainage has stopped.  If drainage continues, she is to return to the emergency department for wound check.  Further symptoms of concern she is to follow-up with OB/GYN.   Pertinent labs & imaging results that were available during my care of the patient were reviewed by me and  considered in my medical decision making (see chart for details).  ____________________________________________   FINAL CLINICAL IMPRESSION(S) / ED DIAGNOSES  Final diagnoses:  Bartholin's gland abscess    Note:  This document was prepared using Dragon voice recognition software and may include unintentional dictation errors.   Chinita Pester, FNP 06/20/20 [id number]    Emily Filbert, MD 06/20/20 1052

## 2020-06-20 NOTE — ED Triage Notes (Signed)
Pt here for abscess to left labia for 2 weeks states has been to gynecologist for the same but has not had it drained. Pt is on antibiotics at this time but now is bigger and she feels like it needs to be drained.

## 2020-06-20 NOTE — Telephone Encounter (Signed)
LMTC

## 2020-06-24 NOTE — Telephone Encounter (Signed)
Pt was seen in the ED 06/20/20.

## 2020-08-21 ENCOUNTER — Ambulatory Visit (INDEPENDENT_AMBULATORY_CARE_PROVIDER_SITE_OTHER): Payer: Medicaid Other | Admitting: Obstetrics and Gynecology

## 2020-08-21 ENCOUNTER — Other Ambulatory Visit: Payer: Self-pay

## 2020-08-21 ENCOUNTER — Encounter: Payer: Self-pay | Admitting: Obstetrics and Gynecology

## 2020-08-21 ENCOUNTER — Other Ambulatory Visit (HOSPITAL_COMMUNITY)
Admission: RE | Admit: 2020-08-21 | Discharge: 2020-08-21 | Disposition: A | Discharge status: Home / Self Care | Payer: Medicaid Other | Source: Ambulatory Visit | Attending: Obstetrics and Gynecology | Admitting: Obstetrics and Gynecology

## 2020-08-21 VITALS — BP 118/70 | HR 88 | Resp 16 | Ht 63.0 in | Wt 141.0 lb

## 2020-08-21 DIAGNOSIS — Z113 Encounter for screening for infections with a predominantly sexual mode of transmission: Secondary | ICD-10-CM

## 2020-08-21 NOTE — Patient Instructions (Signed)
I value your feedback and entrusting us with your care. If you get a [facility] patient survey, I would appreciate you taking the time to let us know about your experience today. Thank you!  As of November 08, 2019, your lab results will be released to your MyChart immediately, before I even have a chance to see them. Please give me time to review them and contact you if there are any abnormalities. Thank you for your patience.  

## 2020-08-21 NOTE — Progress Notes (Signed)
Kristi Medina, MD   Chief Complaint  Patient presents with  . Gynecologic Exam    STD screening    HPI:      [name] is a 19 y.o. G0P0000 whose LMP was Patient's last menstrual period was 07/30/2020., presents today for STD testing. No sx/known exposures. Did have unprotected sex recently. No hx of STDs, no LBP, pelvic pain, fevers.  Neg STD testing 7/21, neg labs 4/21  Past Medical History:  Diagnosis Date  . No known health problems     Past Surgical History:  Procedure Laterality Date  . NO PAST SURGERIES      Family History  Problem Relation Age of Onset  . Cancer Maternal Grandfather   . Breast cancer Neg Hx   . Ovarian cancer Neg Hx     Social History   Socioeconomic History  . Marital status: Single    Spouse name: Not on file  . Number of children: Not on file  . Years of education: Not on file  . Highest education level: Not on file  Occupational History  . Not on file  Tobacco Use  . Smoking status: Never Smoker  . Smokeless tobacco: Never Used  Vaping Use  . Vaping Use: Never used  Substance and Sexual Activity  . Alcohol use: No  . Drug use: Not on file  . Sexual activity: Yes    Birth control/protection: None  Other Topics Concern  . Not on file  Social History Narrative  . Not on file   Social Determinants of Health   Financial Resource Strain:   . Difficulty of Paying Living Expenses: Not on file  Food Insecurity:   . Worried About Programme researcher, broadcasting/film/video in the Last Year: Not on file  . Ran Out of Food in the Last Year: Not on file  Transportation Needs:   . Lack of Transportation (Medical): Not on file  . Lack of Transportation (Non-Medical): Not on file  Physical Activity:   . Days of Exercise per Week: Not on file  . Minutes of Exercise per Session: Not on file  Stress:   . Feeling of Stress : Not on file  Social Connections:   . Frequency of Communication with Friends and Family: Not on file  . Frequency of  Social Gatherings with Friends and Family: Not on file  . Attends Religious Services: Not on file  . Active Member of Clubs or Organizations: Not on file  . Attends Banker Meetings: Not on file  . Marital Status: Not on file  Intimate Partner Violence:   . Fear of Current or Ex-Partner: Not on file  . Emotionally Abused: Not on file  . Physically Abused: Not on file  . Sexually Abused: Not on file    Outpatient Medications Prior to Visit  Medication Sig Dispense Refill  . sulfamethoxazole-trimethoprim (BACTRIM DS) 800-160 MG tablet Take 1 tablet by mouth 2 (two) times daily. 20 tablet 0   No facility-administered medications prior to visit.      ROS:  Review of Systems  Constitutional: Negative for fever.  Gastrointestinal: Negative for blood in stool, constipation, diarrhea, nausea and vomiting.  Genitourinary: Negative for dyspareunia, dysuria, flank pain, frequency, hematuria, urgency, vaginal bleeding, vaginal discharge and vaginal pain.  Musculoskeletal: Negative for back pain.  Skin: Negative for rash.  BREAST: No symptoms   OBJECTIVE:   Vitals:  BP 118/70   Pulse 88   Resp 16   Ht  5\' 3"  (1.6 m)   Wt 141 lb (64 kg)   LMP 07/30/2020   SpO2 98%   BMI 24.98 kg/m   Physical Exam Vitals reviewed.  Constitutional:      Appearance: She is well-developed.  Pulmonary:     Effort: Pulmonary effort is normal.  Genitourinary:    General: Normal vulva.     Pubic Area: No rash.      Labia:        Right: No rash, tenderness or lesion.        Left: No rash, tenderness or lesion.      Vagina: Normal. No vaginal discharge, erythema or tenderness.     Cervix: Normal.     Uterus: Normal. Not enlarged and not tender.      Adnexa: Right adnexa normal and left adnexa normal.       Right: No mass or tenderness.         Left: No mass or tenderness.    Musculoskeletal:        General: Normal range of motion.     Cervical back: Normal range of motion.   Skin:    General: Skin is warm and dry.  Neurological:     General: No focal deficit present.     Mental Status: She is alert and oriented to person, place, and time.  Psychiatric:        Mood and Affect: Mood normal.        Behavior: Behavior normal.        Thought Content: Thought content normal.        Judgment: Judgment normal.     Assessment/Plan: Screening for STD (sexually transmitted disease) - Plan: HIV Antibody (routine testing w rflx), Hepatitis C antibody, HSV 2 antibody, IgG, RPR, Cervicovaginal ancillary only     Return if symptoms worsen or fail to improve.  Artemisa Sladek B. Octivia Canion, PA-C 08/21/2020 10:18 AM

## 2020-08-23 ENCOUNTER — Encounter: Payer: Self-pay | Admitting: Obstetrics and Gynecology

## 2020-08-25 NOTE — Telephone Encounter (Signed)
Can you call [facility] and find out how long they hold the STD swabs? Can we add trich? Thx

## 2020-08-26 LAB — CERVICOVAGINAL ANCILLARY ONLY
Chlamydia: NEGATIVE
Chlamydia: NEGATIVE
Comment: NEGATIVE
Comment: NEGATIVE
Comment: NEGATIVE
Comment: NORMAL
Comment: NORMAL
Neisseria Gonorrhea: NEGATIVE
Neisseria Gonorrhea: NEGATIVE
Trichomonas: NEGATIVE

## 2020-09-25 ENCOUNTER — Other Ambulatory Visit: Payer: Medicaid Other

## 2020-09-25 ENCOUNTER — Other Ambulatory Visit: Payer: Self-pay

## 2020-09-26 LAB — HSV 2 ANTIBODY, IGG: HSV 2 IgG, Type Spec: <0.91 index (ref 0.00–0.90)

## 2020-09-26 LAB — RPR: RPR Ser Ql: NONREACTIVE

## 2020-09-26 LAB — HIV ANTIBODY (ROUTINE TESTING W REFLEX): HIV Screen 4th Generation wRfx: NONREACTIVE

## 2020-09-26 LAB — HEPATITIS C ANTIBODY: Hep C Virus Ab: <0.1 s/co ratio (ref 0.0–0.9)

## 2020-12-08 ENCOUNTER — Ambulatory Visit: Payer: Medicaid Other | Admitting: Obstetrics and Gynecology

## 2020-12-16 ENCOUNTER — Ambulatory Visit: Payer: Medicaid Other | Admitting: Obstetrics and Gynecology

## 2020-12-16 ENCOUNTER — Encounter: Payer: Self-pay | Admitting: Obstetrics and Gynecology

## 2021-01-07 ENCOUNTER — Ambulatory Visit: Payer: Medicaid Other | Admitting: Obstetrics and Gynecology

## 2021-01-08 ENCOUNTER — Ambulatory Visit (INDEPENDENT_AMBULATORY_CARE_PROVIDER_SITE_OTHER): Payer: Medicaid Other | Admitting: Obstetrics and Gynecology

## 2021-01-08 ENCOUNTER — Other Ambulatory Visit (HOSPITAL_COMMUNITY)
Admission: RE | Admit: 2021-01-08 | Discharge: 2021-01-08 | Disposition: A | Discharge status: Home / Self Care | Payer: Medicaid Other | Source: Ambulatory Visit | Attending: Obstetrics and Gynecology | Admitting: Obstetrics and Gynecology

## 2021-01-08 VITALS — BP 107/56 | Ht 63.0 in | Wt 140.0 lb

## 2021-01-08 DIAGNOSIS — L739 Follicular disorder, unspecified: Secondary | ICD-10-CM

## 2021-01-08 DIAGNOSIS — N898 Other specified noninflammatory disorders of vagina: Secondary | ICD-10-CM | POA: Insufficient documentation

## 2021-01-08 DIAGNOSIS — R5383 Other fatigue: Secondary | ICD-10-CM | POA: Diagnosis not present

## 2021-01-08 DIAGNOSIS — N939 Abnormal uterine and vaginal bleeding, unspecified: Secondary | ICD-10-CM | POA: Diagnosis not present

## 2021-01-08 DIAGNOSIS — Z113 Encounter for screening for infections with a predominantly sexual mode of transmission: Secondary | ICD-10-CM | POA: Diagnosis not present

## 2021-01-08 MED ORDER — DOXYCYCLINE HYCLATE 100 MG PO CAPS: 100.0000 mg | ORAL_CAPSULE | Freq: Two times a day (BID) | ORAL | 0 refills | Status: AC

## 2021-01-08 NOTE — Progress Notes (Signed)
Gynecology Abnormal Uterine Bleeding Initial Evaluation   Chief Complaint:  Chief Complaint  Patient presents with  . Pelvic Pain    Irregular menses, 2-3 weeks very heavy w/clots and cramping. Procedure RM    History of Present Illness:    Paitient is a 20 y.o. G0P0000 who LMP was No LMP recorded., presents today for a problem visit.  She complains of menorrhagia that  began several weeks ago and its severity is described as moderate.  The patient menstrual complaints are acute.  She has regular monthly menstrual cycles preceding this episode in the past 3 months, does not feel she could be pregnant. Not on any hormonal contraception.  GC/CT negative 08/21/2020.   Previous evaluation: none Previous Treatment: none.  LMP: No LMP recorded. Menarche:12 Dysmenorrhea: yes  Paramter Normal / Abnormal Prsent  Frequency Amenoorhea     Infrequent (>38 days)     Normal (?24 days ?38 days) X   Freequent (<24 days)    Duration Normal (?8 days)     Prolonged (>8 days) X  Regularity Regular (shortest to longest cycle variation ?7-9 days)* X   Irregular (shortest to longest cycle variation ?8-10days)*    Flow Volume Light    (Self reported) Normal     Heavy X      Intermenstrual Bleeding None X   Random     Cyclical early     Cyclical mid     Cyclical late        Unscheduled Bleeding  Not applicable X  (exogenous hormones) Absent     Present     FIGO AUB I System: *The available evidence suggests that, using these criteria, the normal range (shortest to longest) varies with age: 5-25 y of age, ?84 d; 6-41 y, ?7 d; and for 56-45 y, ?9 d    Review of Systems: ROS  Past Medical History:  Patient Active Problem List   Diagnosis Date Noted  . Chest pain 06/18/2015    Past Surgical History:  Past Surgical History:  Procedure Laterality Date  . NO PAST SURGERIES      Obstetric History: G0P0000  Family History:  Family History  Problem Relation Age of Onset  . Cancer  Maternal Grandfather   . Breast cancer Neg Hx   . Ovarian cancer Neg Hx     Social History:  Social History   Socioeconomic History  . Marital status: Single    Spouse name: Not on file  . Number of children: Not on file  . Years of education: Not on file  . Highest education level: Not on file  Occupational History  . Not on file  Tobacco Use  . Smoking status: Never Smoker  . Smokeless tobacco: Never Used  Vaping Use  . Vaping Use: Never used  Substance and Sexual Activity  . Alcohol use: No  . Drug use: Not on file  . Sexual activity: Yes    Birth control/protection: None  Other Topics Concern  . Not on file  Social History Narrative  . Not on file   Social Determinants of Health   Financial Resource Strain: Not on file  Food Insecurity: Not on file  Transportation Needs: Not on file  Physical Activity: Not on file  Stress: Not on file  Social Connections: Not on file  Intimate Partner Violence: Not on file    Allergies:  No Known Allergies  Medications: Prior to Admission medications   Not on File  Physical Exam Blood pressure (!) 107/56, height 5\' 3"  (1.6 m), weight 140 lb (63.5 kg).  No LMP recorded.  General: NAD HEENT: normocephalic, anicteric Thyroid: no enlargement, no palpable nodules Pulmonary: No increased work of breathing Cardiovascular: RRR, distal pulses 2+ Abdomen: soft, non-tender, non-distended.  Umbilicus without lesions.  No hepatomegaly, splenomegaly or masses palpable. No evidence of hernia  Genitourinary:  External: Normal external female genitalia.  Normal urethral meatus, normal Bartholin's and Skene's glands.   Mons with folliculitis no underlying fluctuance some minor drainage   Vagina: Normal vaginal mucosa, no evidence of prolapse.    Cervix: Grossly normal in appearance, minimal bleeding at cervical os, slightly frothy discharge  Uterus: Non-enlarged, mobile, normal contour.  No CMT  Adnexa: ovaries non-enlarged, no  adnexal masses  Rectal: deferred  Lymphatic: no evidence of inguinal lymphadenopathy Extremities: no edema, erythema, or tenderness Neurologic: Grossly intact Psychiatric: mood appropriate, affect full  Female chaperone present for pelvic portions of the physical exam  Assessment: 20 y.o. G0P0000 with abnormal uterine bleeding  Plan: Problem List Items Addressed This Visit   None   Visit Diagnoses    Abnormal uterine bleeding (AUB)    -  Primary   Relevant Orders   CBC   TSH   Prolactin   26 Transvaginal Non-OB   Other fatigue       Relevant Orders   CBC   TSH   Prolactin   Routine screening for STI (sexually transmitted infection)       Relevant Orders   Cervicovaginal ancillary only   Vaginal discharge       Relevant Orders   Cervicovaginal ancillary only   Folliculitis          1) Discussed management options for abnormal uterine bleeding including expectant, NSAIDs, tranexamic acid (Lysteda), oral progesterone (Provera, norethindrone, megace), Depo Provera, Levonorgestrel containing IUD, endometrial ablation (Novasure) or hysterectomy as definitive surgical management.  Discussed risks and benefits of each method.   Final management decision will hinge on results of patient's work up and whether an underlying etiology for the patients bleeding symptoms can be discerned.  We will conduct a basic work up examining using the PALM-COIEN classification system. Bleeding precautions reviewed.  - TVUS ordered - CBC, prolactin, TSH ordered - GC/CT and trichomonas obtained  2) Return in about 1 week (around 01/15/2021) for TVUS and follow up.   01/17/2021, MD, Vena Austria [facility], [facility] 01/08/2021, 10:35 AM

## 2021-01-09 ENCOUNTER — Other Ambulatory Visit: Payer: Self-pay | Admitting: Obstetrics and Gynecology

## 2021-01-09 LAB — CBC
Hematocrit: 28.7 % — ABNORMAL LOW (ref 34.0–46.6)
Hemoglobin: 8.1 g/dL — ABNORMAL LOW (ref 11.1–15.9)
MCH: 22 pg — ABNORMAL LOW (ref 26.6–33.0)
MCHC: 28.2 g/dL — ABNORMAL LOW (ref 31.5–35.7)
MCV: 78 fL — ABNORMAL LOW (ref 79–97)
Platelets: 401 10*3/uL (ref 150–450)
RBC: 3.69 x10E6/uL — ABNORMAL LOW (ref 3.77–5.28)
RDW: 15.9 % — ABNORMAL HIGH (ref 11.7–15.4)
WBC: 5.1 10*3/uL (ref 3.4–10.8)

## 2021-01-09 LAB — TSH: TSH: 0.833 u[IU]/mL (ref 0.450–4.500)

## 2021-01-09 LAB — PROLACTIN: Prolactin: 52.8 ng/mL — ABNORMAL HIGH (ref 4.8–23.3)

## 2021-01-09 NOTE — Progress Notes (Signed)
This patient is scheduled for ultrasound follow up with [facility].  If she requested not to see me that's fine but it doesn't make sense to switch providers mid work up

## 2021-01-09 NOTE — Progress Notes (Signed)
Labs reviewed with patient.  Anemia is longstanding, microcytic consistent with iron deficiency.  Would recommend iron panel, hemoglobinopathy evaluation to rule out sickle cell trait or thalassemia.  Prolactin would repeat AM fasting value when she has her ultrasound on Monday.  If remains elevated next portion of work up would be MRI and starting bromocriptine or cabergoline.

## 2021-01-12 ENCOUNTER — Other Ambulatory Visit: Payer: Self-pay

## 2021-01-12 ENCOUNTER — Ambulatory Visit: Payer: Medicaid Other | Admitting: Obstetrics & Gynecology

## 2021-01-12 ENCOUNTER — Encounter: Payer: Self-pay | Admitting: Obstetrics and Gynecology

## 2021-01-12 ENCOUNTER — Ambulatory Visit (INDEPENDENT_AMBULATORY_CARE_PROVIDER_SITE_OTHER): Payer: Medicaid Other

## 2021-01-12 ENCOUNTER — Ambulatory Visit (INDEPENDENT_AMBULATORY_CARE_PROVIDER_SITE_OTHER): Payer: Medicaid Other | Admitting: Obstetrics and Gynecology

## 2021-01-12 VITALS — BP 110/58 | Wt 144.0 lb

## 2021-01-12 DIAGNOSIS — R7989 Other specified abnormal findings of blood chemistry: Secondary | ICD-10-CM

## 2021-01-12 DIAGNOSIS — D509 Iron deficiency anemia, unspecified: Secondary | ICD-10-CM

## 2021-01-12 DIAGNOSIS — N939 Abnormal uterine and vaginal bleeding, unspecified: Secondary | ICD-10-CM

## 2021-01-12 DIAGNOSIS — N83291 Other ovarian cyst, right side: Secondary | ICD-10-CM

## 2021-01-12 DIAGNOSIS — R5383 Other fatigue: Secondary | ICD-10-CM

## 2021-01-12 LAB — CERVICOVAGINAL ANCILLARY ONLY
Chlamydia: NEGATIVE
Comment: NEGATIVE
Comment: NEGATIVE
Comment: NORMAL
Neisseria Gonorrhea: NEGATIVE
Trichomonas: NEGATIVE

## 2021-01-12 MED ORDER — MEDROXYPROGESTERONE ACETATE 10 MG PO TABS: 10.0000 mg | ORAL_TABLET | Freq: Every day | ORAL | 0 refills | Status: DC

## 2021-01-12 NOTE — Progress Notes (Signed)
Gynecology Ultrasound Follow Up  Chief Complaint:  Chief Complaint  Patient presents with  . Follow-up    F/U TVUS. RM 5     History of Present Illness: Patient is a 20 y.o. female who presents today for ultrasound evaluation of AUB .  Ultrasound demonstrates the following findgins Adnexa: Complex right ovarian cyst The cyst in the right ovary measures 37.6 x 28.1 x 32.8 mm with no doppler flow within the cyst, normal doppler flow within right ovary otherwise.  Left ovary images normally Uterus: Non-enlarged with endometrial stripe slightly thickened but devoid of focal abnormalities Additional: no free fluid  Review of Systems: Review of Systems  Constitutional: Negative.   Gastrointestinal: Negative.   Genitourinary: Negative.     Past Medical History:  Past Medical History:  Diagnosis Date  . No known health problems     Past Surgical History:  Past Surgical History:  Procedure Laterality Date  . NO PAST SURGERIES      Gynecologic History:  Patient's last menstrual period was 12/13/2020. Contraception: none Last Pap:N/A under 21  Family History:  Family History  Problem Relation Age of Onset  . Cancer Maternal Grandfather   . Breast cancer Neg Hx   . Ovarian cancer Neg Hx     Social History:  Social History   Socioeconomic History  . Marital status: Single    Spouse name: Not on file  . Number of children: Not on file  . Years of education: Not on file  . Highest education level: Not on file  Occupational History  . Not on file  Tobacco Use  . Smoking status: Never Smoker  . Smokeless tobacco: Never Used  Vaping Use  . Vaping Use: Never used  Substance and Sexual Activity  . Alcohol use: No  . Drug use: Not on file  . Sexual activity: Yes    Birth control/protection: None  Other Topics Concern  . Not on file  Social History Narrative  . Not on file   Social Determinants of Health   Financial Resource Strain: Not on file  Food  Insecurity: Not on file  Transportation Needs: Not on file  Physical Activity: Not on file  Stress: Not on file  Social Connections: Not on file  Intimate Partner Violence: Not on file    Allergies:  No Known Allergies  Medications: Prior to Admission medications   Medication Sig Start Date End Date Taking? Authorizing Provider  doxycycline (VIBRAMYCIN) 100 MG capsule Take 1 capsule (100 mg total) by mouth 2 (two) times daily for 7 days. 01/08/21 01/15/21  Vena Austria, MD    Physical Exam Vitals: Blood pressure (!) 110/58, weight 144 lb (65.3 kg), last menstrual period 12/13/2020.  General: NAD HEENT: normocephalic, anicteric Pulmonary: No increased work of breathing Extremities: no edema, erythema, or tenderness Neurologic: Grossly intact, normal gait Psychiatric: mood appropriate, affect full   Assessment: 20 y.o. G0P0000 No problem-specific Assessment & Plan notes found for this encounter.   Plan: Problem List Items Addressed This Visit   None   Visit Diagnoses    Microcytic hypochromic anemia    -  Primary   Relevant Orders   Iron   Iron Binding Cap (TIBC)   Ferritin   Hgb Fractionation Cascade   Fatigue, unspecified type       Relevant Orders   Iron   Iron Binding Cap (TIBC)   Ferritin   Hgb Fractionation Cascade   Elevated prolactin level  Relevant Orders   Prolactin   Abnormal uterine bleeding       Relevant Orders   Prolactin   Iron   Iron Binding Cap (TIBC)   Ferritin   Hgb Fractionation Cascade   Inhibin B   Estradiol   Complex cyst of right ovary       Relevant Orders   Inhibin B   Estradiol      1) AUB - work up abnormal CBC and elevated prolactin. Structurally normal uterus on imaging today  - Anemia is longstanding, microcytic consistent with iron deficiency.  Would recommend iron panel, hemoglobinopathy evaluation to rule out sickle cell trait or thalassemia.  - Prolactin repeat AM fasting value today.  If remains  elevated next portion of work up would be MRI and starting bromocriptine or cabergoline.  - Provera 10mg  po x 10 days to see if can stop this current bleeding episode  2) Complex right ovarian cyst - given bleeding concern will check inhibin and estradiol level to rule out granulosa cell tumor.  If normal plan follow up imaging in 6-8 weeks  3) A total of 15 minutes were spent in face-to-face contact with the patient during this encounter with over half of that time devoted to counseling and coordination of care.  4) Return in about 6 weeks (around 02/23/2021) for follow up ultrasound right ovarian cyst.    02/25/2021, MD, [facility], [facility] 01/12/2021, 11:51 AM

## 2021-01-14 ENCOUNTER — Other Ambulatory Visit: Payer: Self-pay | Admitting: Obstetrics and Gynecology

## 2021-01-14 LAB — IRON AND TIBC
Iron Saturation: 5 % — CL (ref 15–55)
Iron: 18 ug/dL — ABNORMAL LOW (ref 27–159)
Total Iron Binding Capacity: 393 ug/dL (ref 250–450)
UIBC: 375 ug/dL (ref 131–425)

## 2021-01-14 LAB — ESTRADIOL: Estradiol: 195 pg/mL

## 2021-01-14 LAB — INHIBIN B: Inhibin B: 99 pg/mL

## 2021-01-14 LAB — HGB FRACTIONATION CASCADE
Hgb A2: 1.8 % (ref 1.8–3.2)
Hgb A: 98.2 % (ref 96.4–98.8)
Hgb F: 0 % (ref 0.0–2.0)
Hgb S: 0 %

## 2021-01-14 LAB — FERRITIN: Ferritin: 6 ng/mL — ABNORMAL LOW (ref 15–150)

## 2021-01-14 LAB — PROLACTIN: Prolactin: 8.9 ng/mL (ref 4.8–23.3)

## 2021-01-14 MED ORDER — FUSION 65-65-25-30 MG PO CAPS: 1.0000 | ORAL_CAPSULE | Freq: Every day | ORAL | 11 refills | Status: DC

## 2021-01-26 ENCOUNTER — Other Ambulatory Visit
Admission: RE | Admit: 2021-01-26 | Discharge: 2021-01-26 | Disposition: A | Discharge status: Home / Self Care | Payer: Medicaid Other | Source: Ambulatory Visit | Attending: Pediatrics | Admitting: Pediatrics

## 2021-01-26 DIAGNOSIS — D509 Iron deficiency anemia, unspecified: Secondary | ICD-10-CM | POA: Insufficient documentation

## 2021-01-28 LAB — HGB FRACTIONATION CASCADE
Hgb A2: 1.9 % (ref 1.8–3.2)
Hgb A: 98.1 % (ref 96.4–98.8)
Hgb F: 0 % (ref 0.0–2.0)
Hgb S: 0 %

## 2021-02-25 ENCOUNTER — Ambulatory Visit (INDEPENDENT_AMBULATORY_CARE_PROVIDER_SITE_OTHER): Payer: Medicaid Other | Admitting: Obstetrics and Gynecology

## 2021-02-25 ENCOUNTER — Ambulatory Visit: Payer: Medicaid Other

## 2021-02-25 ENCOUNTER — Encounter: Payer: Self-pay | Admitting: Obstetrics and Gynecology

## 2021-02-25 ENCOUNTER — Other Ambulatory Visit: Payer: Self-pay

## 2021-02-25 VITALS — BP 118/62 | Ht 63.0 in | Wt 145.0 lb

## 2021-02-25 DIAGNOSIS — N83291 Other ovarian cyst, right side: Secondary | ICD-10-CM

## 2021-02-25 DIAGNOSIS — N83201 Unspecified ovarian cyst, right side: Secondary | ICD-10-CM

## 2021-02-25 DIAGNOSIS — R102 Pelvic and perineal pain: Secondary | ICD-10-CM

## 2021-02-25 DIAGNOSIS — Z8742 Personal history of other diseases of the female genital tract: Secondary | ICD-10-CM

## 2021-02-25 NOTE — Progress Notes (Signed)
Gynecology Ultrasound Follow Up  Chief Complaint:  Chief Complaint  Patient presents with  . Follow-up    TVUS (bedside) [place] RM     History of Present Illness: Patient is a 20 y.o. female who presents today for ultrasound evaluation of right adnexal mass.  Bedside ultrasound  Ultrasound demonstrates the following findgins Adnexa: Bilateral adnexa image nromally.  No evidence of previously imaged adnexal mass Uterus: Non-enlarged, no evidence of fibroids with endometrial stripe 6.15mm  Additional: no free fluid  Patient denies pelvic pain.  Review of Systems: Review of Systems  Constitutional: Negative.   Gastrointestinal: Negative.   Genitourinary: Negative.     Past Medical History:  Past Medical History:  Diagnosis Date  . No known health problems     Past Surgical History:  Past Surgical History:  Procedure Laterality Date  . NO PAST SURGERIES      Gynecologic History:  Patient's last menstrual period was 02/12/2021.  Family History:  Family History  Problem Relation Age of Onset  . Cancer Maternal Grandfather   . Breast cancer Neg Hx   . Ovarian cancer Neg Hx     Social History:  Social History   Socioeconomic History  . Marital status: Single    Spouse name: Not on file  . Number of children: Not on file  . Years of education: Not on file  . Highest education level: Not on file  Occupational History  . Not on file  Tobacco Use  . Smoking status: Never Smoker  . Smokeless tobacco: Never Used  Vaping Use  . Vaping Use: Never used  Substance and Sexual Activity  . Alcohol use: No  . Drug use: Not on file  . Sexual activity: Yes    Birth control/protection: None  Other Topics Concern  . Not on file  Social History Narrative  . Not on file   Social Determinants of Health   Financial Resource Strain: Not on file  Food Insecurity: Not on file  Transportation Needs: Not on file  Physical Activity: Not on file  Stress: Not on file   Social Connections: Not on file  Intimate Partner Violence: Not on file    Allergies:  No Known Allergies  Medications: Prior to Admission medications   Medication Sig Start Date End Date Taking? Authorizing Provider  Fe Fum-Fe Poly-Vit C-Lactobac (FUSION) 65-65-25-30 MG CAPS TAKE 1 CAPSULE BY MOUTH EVERY DAY 01/14/21   Vena Austria, MD  medroxyPROGESTERone (PROVERA) 10 MG tablet Take 1 tablet (10 mg total) by mouth daily. Use for ten days Patient not taking: Reported on 02/25/2021 01/12/21   Vena Austria, MD    Physical Exam Vitals: Blood pressure 118/62, height 5\' 3"  (1.6 m), weight 145 lb (65.8 kg), last menstrual period 02/12/2021.  General: NAD HEENT: normocephalic, anicteric Pulmonary: No increased work of breathing Extremities: no edema, erythema, or tenderness Neurologic: Grossly intact, normal gait Psychiatric: mood appropriate, affect ful  Assessment: 20 y.o. G0P0000 follow up right ovarian cyst  Plan: Problem List Items Addressed This Visit   None   Visit Diagnoses    Right ovarian cyst    -  Primary      1) Right ovarian cyst - resolution on follow up imaging today. No new symptoms.    2) A total of 15 minutes were spent in face-to-face contact with the patient during this encounter with over half of that time devoted to counseling and coordination of care.  3) Return in about 1 year (around  02/25/2022) for annual.    Vena Austria, MD, [facility], [facility] 02/25/2021, 11:45 AM

## 2021-03-23 ENCOUNTER — Other Ambulatory Visit: Payer: Self-pay | Admitting: Obstetrics and Gynecology

## 2021-03-23 DIAGNOSIS — Z8742 Personal history of other diseases of the female genital tract: Secondary | ICD-10-CM

## 2021-03-23 DIAGNOSIS — R102 Pelvic and perineal pain: Secondary | ICD-10-CM

## 2021-03-23 DIAGNOSIS — L739 Follicular disorder, unspecified: Secondary | ICD-10-CM

## 2021-03-27 ENCOUNTER — Ambulatory Visit: Payer: Medicaid Other | Admitting: Obstetrics and Gynecology

## 2021-04-15 NOTE — Progress Notes (Deleted)
PCP:  [name], MD   No chief complaint on file.    HPI:      [name] is a 20 y.o. G0P0000 who LMP was No LMP recorded., presents today for her annual examination.  Her menses are regular every 28-30 days, lasting 5-7 days.  Dysmenorrhea mild, occurring first 1-2 days of flow. She does not have intermenstrual bleeding. Had episode of AUB 01/08/21 and saw Dr. Bonney Aid. U/s showed RTO cyst with resolution on 3/22 GYN u/s, and anemia on labs. Taking Fe supp. Due for lab recheck. Pt then developed recurrent pelvic pain 4/22 and GYN u/s order placed for [facility] (due to u/s shortage)  Sex activity: not current active, has been in past. Did xulane in past. Declines BC for now. Last Pap: N/A due to age Hx of STDs: none; neg gon/chlam 2/22  There is no FH of breast cancer. There is no FH of ovarian cancer. The patient does not do self-breast exams.  Tobacco use: The patient denies current or previous tobacco use. Alcohol use: none No drug use.  Exercise: moderately active  She does not get adequate calcium and Vitamin D in her diet. Gardasil not done  Past Medical History:  Diagnosis Date  . No known health problems     Past Surgical History:  Procedure Laterality Date  . NO PAST SURGERIES      Family History  Problem Relation Age of Onset  . Cancer Maternal Grandfather   . Breast cancer Neg Hx   . Ovarian cancer Neg Hx     Social History   Socioeconomic History  . Marital status: Single    Spouse name: Not on file  . Number of children: Not on file  . Years of education: Not on file  . Highest education level: Not on file  Occupational History  . Not on file  Tobacco Use  . Smoking status: Never Smoker  . Smokeless tobacco: Never Used  Vaping Use  . Vaping Use: Never used  Substance and Sexual Activity  . Alcohol use: No  . Drug use: Not on file  . Sexual activity: Yes    Birth control/protection: None  Other Topics Concern  . Not on file   Social History Narrative  . Not on file   Social Determinants of Health   Financial Resource Strain: Not on file  Food Insecurity: Not on file  Transportation Needs: Not on file  Physical Activity: Not on file  Stress: Not on file  Social Connections: Not on file  Intimate Partner Violence: Not on file     Current Outpatient Medications:  .  Fe Fum-Fe Poly-Vit C-Lactobac (FUSION) 65-65-25-30 MG CAPS, TAKE 1 CAPSULE BY MOUTH EVERY DAY, Disp: 30 capsule, Rfl: 11 .  medroxyPROGESTERone (PROVERA) 10 MG tablet, Take 1 tablet (10 mg total) by mouth daily. Use for ten days (Patient not taking: Reported on 02/25/2021), Disp: 10 tablet, Rfl: 0     ROS:  Review of Systems  Constitutional: Negative for fatigue, fever and unexpected weight change.  Respiratory: Negative for cough, shortness of breath and wheezing.   Cardiovascular: Negative for chest pain, palpitations and leg swelling.  Gastrointestinal: Negative for blood in stool, constipation, diarrhea, nausea and vomiting.  Endocrine: Negative for cold intolerance, heat intolerance and polyuria.  Genitourinary: Negative for dyspareunia, dysuria, flank pain, frequency, genital sores, hematuria, menstrual problem, pelvic pain, urgency, vaginal bleeding, vaginal discharge and vaginal pain.  Musculoskeletal: Negative for back pain, joint swelling and  myalgias.  Skin: Negative for rash.  Neurological: Negative for dizziness, syncope, light-headedness, numbness and headaches.  Hematological: Negative for adenopathy.  Psychiatric/Behavioral: Negative for agitation, confusion, sleep disturbance and suicidal ideas. The patient is not nervous/anxious.   BREAST: No symptoms   Objective: There were no vitals taken for this visit.   Physical Exam Constitutional:      Appearance: She is well-developed.  Genitourinary:     Vulva normal.     No vaginal discharge, erythema or tenderness.      Right Adnexa: not tender and no mass present.     Left Adnexa: not tender and no mass present.    No cervical polyp.     Uterus is not enlarged or tender.  Breasts:     Right: No mass, nipple discharge, skin change or tenderness.     Left: No mass, nipple discharge, skin change or tenderness.    Neck:     Thyroid: No thyromegaly.  Cardiovascular:     Rate and Rhythm: Normal rate and regular rhythm.     Heart sounds: Normal heart sounds. No murmur heard.   Pulmonary:     Effort: Pulmonary effort is normal.     Breath sounds: Normal breath sounds.  Abdominal:     Palpations: Abdomen is soft.     Tenderness: There is no abdominal tenderness. There is no guarding.  Musculoskeletal:        General: Normal range of motion.     Cervical back: Normal range of motion.  Neurological:     General: No focal deficit present.     Mental Status: She is alert and oriented to person, place, and time.     Cranial Nerves: No cranial nerve deficit.  Skin:    General: Skin is warm and dry.  Psychiatric:        Mood and Affect: Mood normal.        Behavior: Behavior normal.        Thought Content: Thought content normal.        Judgment: Judgment normal.  Vitals reviewed.    Assessment/Plan: No diagnosis found.            GYN counsel adequate intake of calcium and vitamin D, diet and exercise; Gardasil--f/u with PCP due to insurance     F/U  No follow-ups on file.  Ronnell Clinger B. Malvika Tung, PA-C 04/15/2021 2:40 PM

## 2021-04-15 NOTE — Patient Instructions (Incomplete)
I value your feedback and you entrusting us with your care. If you get a [facility] patient survey, I would appreciate you taking the time to let us know about your experience today. Thank you! ? ? ?

## 2021-04-16 ENCOUNTER — Ambulatory Visit: Payer: Medicaid Other | Admitting: Obstetrics and Gynecology

## 2021-04-16 DIAGNOSIS — D500 Iron deficiency anemia secondary to blood loss (chronic): Secondary | ICD-10-CM

## 2021-04-16 DIAGNOSIS — Z01419 Encounter for gynecological examination (general) (routine) without abnormal findings: Secondary | ICD-10-CM

## 2021-04-16 DIAGNOSIS — R102 Pelvic and perineal pain: Secondary | ICD-10-CM

## 2021-05-28 ENCOUNTER — Ambulatory Visit (INDEPENDENT_AMBULATORY_CARE_PROVIDER_SITE_OTHER): Payer: Medicaid Other | Admitting: Dermatology

## 2021-05-28 ENCOUNTER — Other Ambulatory Visit: Payer: Self-pay

## 2021-05-28 DIAGNOSIS — L732 Hidradenitis suppurativa: Secondary | ICD-10-CM

## 2021-05-28 MED ORDER — CLINDAMYCIN PHOSPHATE 1 % EX LOTN
TOPICAL_LOTION | Freq: Every day | CUTANEOUS | 6 refills | Status: DC
Start: 2021-05-28 — End: 2021-06-04

## 2021-05-28 NOTE — Progress Notes (Signed)
   New Patient Visit  Subjective  Kristi Herrera is a 20 y.o. female who presents for the following: Other (History of cysts of buttocks and groin area - no active areas today. Has taken doxycycline in the past.).  The following portions of the chart were reviewed this encounter and updated as appropriate:   Tobacco  Allergies  Meds  Problems  Med Hx  Surg Hx  Fam Hx      Review of Systems:  No other skin or systemic complaints except as noted in HPI or Assessment and Plan.  Objective  Well appearing patient in no apparent distress; mood and affect are within normal limits.  A focused examination was performed including groin, buttocks. Relevant physical exam findings are noted in the Assessment and Plan.  Pubic 3 resolving papules of pubic area. Resolving papule and hyperpigmented macules of buttocks.   Assessment & Plan  Hidradenitis suppurativa Pubic and buttock areas  Hidradenitis Suppurativa is a chronic; persistent; non-curable, but treatable condition due to abnormal inflamed sweat glands in the body folds (axilla, inframammary, groin, medial thighs), causing recurrent painful cysts and scarring. It can be associated with severe scarring acne and cysts; abscesses and scarring of scalp. The goal is control and prevention of flares, as it is not curable. Scars are permanent and can be thickened. Treatment may include daily use of topical medication and oral antibiotics.  Oral isotretinoin may also be helpful.  For more severe cases, Humira (a biologic injection) may be prescribed to decrease the inflammatory process and prevent flares.  When indicated, inflamed cysts may also be treated surgically.  Recommend Cln acne wash. Start Clindamycin lotion qd  Discussed oral antibiotics vs Isotretinoin vs Humira. Information about Humira given today.  clindamycin (CLEOCIN-T) 1 % lotion - Pubic Apply topically daily.  Return in about 4 months (around 09/27/2021).  I, Joanie Coddington, CMA, am acting as scribe for Armida Sans, MD .  Documentation: I have reviewed the above documentation for accuracy and completeness, and I agree with the above.  Armida Sans, MD

## 2021-05-28 NOTE — Patient Instructions (Addendum)
Hidradenitis Suppurativa is a chronic; persistent; non-curable, but treatable condition due to abnormal inflamed sweat glands in the body folds (axilla, inframammary, groin, medial thighs), causing recurrent painful cysts and scarring. It can be associated with severe scarring acne and cysts; abscesses and scarring of scalp. The goal is control and prevention of flares, as it is not curable. Scars are permanent and can be thickened. Treatment may include daily use of topical medication and oral antibiotics.  Oral isotretinoin may also be helpful.  For more severe cases, Humira (a biologic injection) may be prescribed to decrease the inflammatory process and prevent flares.  When indicated, inflamed cysts may also be treated surgically.  If you have any questions or concerns for your doctor, please call our main line at [phone] and press option 4 to reach your doctor's medical assistant. If no one answers, please leave a voicemail as directed and we will return your call as soon as possible. Messages left after 4 pm will be answered the following business day.   You may also send [place] a message via MyChart. We typically respond to MyChart messages within 1-2 business days.  For prescription refills, please ask your pharmacy to contact our office. Our fax number is [phone].  If you have an urgent issue when the clinic is closed that cannot wait until the next business day, you can page your doctor at the number below.    Please note that while we do our best to be available for urgent issues outside of office hours, we are not available 24/7.   If you have an urgent issue and are unable to reach [place], you may choose to seek medical care at your doctor's office, retail clinic, urgent care center, or emergency room.  If you have a medical emergency, please immediately call 911 or go to the emergency department.  Pager Numbers  - Dr. Gwen Pounds: [phone]  - Dr. Neale Burly: [phone]  - Dr.  Roseanne Reno: [phone]  In the event of inclement weather, please call our main line at [phone] for an update on the status of any delays or closures.  Dermatology Medication Tips: Please keep the boxes that topical medications come in in order to help keep track of the instructions about where and how to use these. Pharmacies typically print the medication instructions only on the boxes and not directly on the medication tubes.   If your medication is too expensive, please contact our office at [phone] option 4 or send [place] a message through MyChart.   We are unable to tell what your co-pay for medications will be in advance as this is different depending on your insurance coverage. However, we may be able to find a substitute medication at lower cost or fill out paperwork to get insurance to cover a needed medication.   If a prior authorization is required to get your medication covered by your insurance company, please allow [place] 1-2 business days to complete this process.  Drug prices often vary depending on where the prescription is filled and some pharmacies may offer cheaper prices.  The website www.goodrx.com contains coupons for medications through different pharmacies. The prices here do not account for what the cost may be with help from insurance (it may be cheaper with your insurance), but the website can give you the price if you did not use any insurance.  - You can print the associated coupon and take it with your prescription to the pharmacy.  - You may also stop by our  office during regular business hours and pick up a GoodRx coupon card.  - If you need your prescription sent electronically to a different pharmacy, notify our office through [facility] or by phone at [phone] option 4.    Recommend CLN Acne Wash daily

## 2021-06-04 ENCOUNTER — Other Ambulatory Visit: Payer: Self-pay

## 2021-06-04 MED ORDER — CLINDAMYCIN PHOSPHATE 1 % EX SOLN
Freq: Every day | CUTANEOUS | 0 refills | Status: AC
Start: 2021-06-04 — End: 2022-06-04

## 2021-06-04 NOTE — Progress Notes (Signed)
Clindamycin switched to Solution due to [facility] formulary.

## 2021-06-06 ENCOUNTER — Encounter: Payer: Self-pay | Admitting: Dermatology

## 2021-06-17 ENCOUNTER — Encounter: Payer: Self-pay | Admitting: Obstetrics and Gynecology

## 2021-06-17 ENCOUNTER — Ambulatory Visit (INDEPENDENT_AMBULATORY_CARE_PROVIDER_SITE_OTHER): Payer: Medicaid Other | Admitting: Obstetrics and Gynecology

## 2021-06-17 DIAGNOSIS — R102 Pelvic and perineal pain: Secondary | ICD-10-CM | POA: Diagnosis not present

## 2021-06-17 DIAGNOSIS — N83201 Unspecified ovarian cyst, right side: Secondary | ICD-10-CM | POA: Diagnosis not present

## 2021-06-17 NOTE — Progress Notes (Signed)
I connected with Erskin Burnet on 06/17/21 at  1:50 PM EDT by telephone and verified that I am speaking with the correct person using two identifiers.   I discussed the limitations, risks, security and privacy concerns of performing an evaluation and management service by telephone and the availability of in person appointments. I also discussed with the patient that there may be a patient responsible charge related to this service. The patient expressed understanding and agreed to proceed.  The patient was at home I spoke with the patient from my workstation phone The names of people involved in this encounter were: Erskin Burnet , and Vena Austria   Obstetrics & Gynecology Office Visit   Chief Complaint:  Chief Complaint  Patient presents with   Follow-up    U/S 05/25/21 BIBC Right Ovarian Cyst    History of Present Illness: 20 y.o. presenting for follow up of pelvic pain and previously imaged, then resolved right ovarian cyst.  She reports no further symptoms attributable to her cyst.  Reports normal last menstrual cycle in scope of duration and flow.  She reports missing her annual earlier this year.    Was started on clindamycin gel for hydradenitis suppurativa.  Reports some improvement in symptoms since starting.     Review of Systems: Review of Systems  Constitutional: Negative.   Gastrointestinal: Negative.   Genitourinary: Negative.   Skin: Negative.     Past Medical History:  Past Medical History:  Diagnosis Date   Anemia    No known health problems     Past Surgical History:  Past Surgical History:  Procedure Laterality Date   NO PAST SURGERIES      Gynecologic History: No LMP recorded.  Obstetric History: G0P0000  Family History:  Family History  Problem Relation Age of Onset   Cancer Maternal Grandfather    Breast cancer Neg Hx    Ovarian cancer Neg Hx     Social History:  Social History   Socioeconomic History   Marital status: Single     Spouse name: Not on file   Number of children: Not on file   Years of education: Not on file   Highest education level: Not on file  Occupational History   Not on file  Tobacco Use   Smoking status: Never   Smokeless tobacco: Never  Vaping Use   Vaping Use: Never used  Substance and Sexual Activity   Alcohol use: No   Drug use: Not on file   Sexual activity: Yes    Birth control/protection: None  Other Topics Concern   Not on file  Social History Narrative   Not on file   Social Determinants of Health   Financial Resource Strain: Not on file  Food Insecurity: Not on file  Transportation Needs: Not on file  Physical Activity: Not on file  Stress: Not on file  Social Connections: Not on file  Intimate Partner Violence: Not on file    Allergies:  No Known Allergies  Medications: Prior to Admission medications   Medication Sig Start Date End Date Taking? Authorizing Provider  clindamycin (CLEOCIN T) 1 % external solution Apply topically daily. 06/04/21 06/04/22 Yes Deirdre Evener, MD  Fe Fum-Fe Poly-Vit C-Lactobac (FUSION) 65-65-25-30 MG CAPS TAKE 1 CAPSULE BY MOUTH EVERY DAY Patient not taking: Reported on 06/17/2021 01/14/21   Vena Austria, MD    Physical Exam Vitals: There were no vitals filed for this visit. No LMP recorded.  No physical exam as this  was a remote telephone visit to promote social distancing during the current COVID-19 Pandemic  Assessment: 20 y.o. G0P0000 follow up pelvic pain, right ovarian cyst  Plan: Problem List Items Addressed This Visit   None Visit Diagnoses     Right ovarian cyst    -  Primary   Pelvic pain          1) Pelvic pain follow up - cyst resolved last cycle visit 02/25/2021.  At time the impression was hemorrhagic cyst.  She reports continued improvement in her symptoms, last menstrual cycle normal in flow and duration.  - no further follow up for ROV cyst   2) Patient missed prior annual will reschedule, due for  pap as 20st birthday [birth year]  3) Telephone time: 8:64min  4) Return in about 2 weeks (around 07/01/2021) for 2-4 week annual exam.   Vena Austria, MD, [facility], [facility]

## 2021-07-02 ENCOUNTER — Telehealth: Payer: Self-pay

## 2021-07-02 NOTE — Telephone Encounter (Signed)
Pt states she would like to get a work note because she has a ovarian cyst , vaginal bleeding and sickle-cell issue .I told she has not been in the office since 02/25/21. And she maybe need to get the note from her PCP or come in to be seen.

## 2021-07-02 NOTE — Telephone Encounter (Signed)
Yes we can't give out work notes like that I saw her 7/20 and she was having no symptoms at that time

## 2021-07-03 NOTE — Telephone Encounter (Signed)
See separate my chart message that patient had sent at 9:35 which was replied to by Lupita Leash L at 9:51. Closing message as patient has already been contacted.

## 2021-07-03 NOTE — Telephone Encounter (Signed)
Patient called this am at 8:51 am stating she was following on speaking with AMS> I advise patient AMS is out office and will not be available until Monday. Would you please contact patient.

## 2021-07-06 NOTE — Telephone Encounter (Signed)
Can be first available does not need overbook

## 2021-07-20 ENCOUNTER — Ambulatory Visit: Payer: Medicaid Other | Admitting: Obstetrics and Gynecology

## 2021-07-22 ENCOUNTER — Other Ambulatory Visit (HOSPITAL_COMMUNITY)
Admission: RE | Admit: 2021-07-22 | Discharge: 2021-07-22 | Disposition: A | Discharge status: Home / Self Care | Payer: Medicaid Other | Source: Ambulatory Visit | Attending: Obstetrics and Gynecology | Admitting: Obstetrics and Gynecology

## 2021-07-22 ENCOUNTER — Ambulatory Visit (INDEPENDENT_AMBULATORY_CARE_PROVIDER_SITE_OTHER): Payer: Medicaid Other | Admitting: Obstetrics and Gynecology

## 2021-07-22 ENCOUNTER — Other Ambulatory Visit: Payer: Self-pay

## 2021-07-22 ENCOUNTER — Encounter: Payer: Self-pay | Admitting: Obstetrics and Gynecology

## 2021-07-22 VITALS — BP 116/55 | Ht 63.0 in | Wt 150.0 lb

## 2021-07-22 DIAGNOSIS — N939 Abnormal uterine and vaginal bleeding, unspecified: Secondary | ICD-10-CM | POA: Diagnosis not present

## 2021-07-22 DIAGNOSIS — Z124 Encounter for screening for malignant neoplasm of cervix: Secondary | ICD-10-CM | POA: Diagnosis not present

## 2021-07-22 DIAGNOSIS — Z113 Encounter for screening for infections with a predominantly sexual mode of transmission: Secondary | ICD-10-CM

## 2021-07-22 MED ORDER — XULANE 150-35 MCG/24HR TD PTWK: 1.0000 | MEDICATED_PATCH | TRANSDERMAL | 11 refills | Status: DC

## 2021-07-22 NOTE — Progress Notes (Signed)
Gynecology Abnormal Uterine Bleeding Initial Evaluation   Chief Complaint:  Chief Complaint  Patient presents with   Menstrual Problem    Pain with menses sometimes, it's hit or miss. RM 3     History of Present Illness:    Paitient is a 20 y.o. G0P0000 who LMP was Patient's last menstrual period was 07/13/2021 (approximate)., presents today for a problem visit.  She complains of menorrhagia that  began several months ago and its severity is described as moderate.  The patient menstrual complaints are acute.  She has regular periods every month and they are associated with moderate menstrual cramping.  She has used the following for attempts at control: maxi pad, reports having to change pad about every 45 minutes fully saturated during heaviest part of cycle.  She does report passage of clots.  She states some cycles are heavier than others.  Duration is normal. She is concerned that this is because the cyst has returned.  She has not had a pap recently turned 20.    Previous evaluation: TVUS  showing a 37.7 x 28.1 x 32.39mm complex cyst that was resolved on follow up imaging in April and felt to be most consistent with lacy internal echo consistent with hemorrhagic cyst.  She also had GC/CT cultures obtained 01/08/21 which were negative   Previous Treatment: none.  LMP: Patient's last menstrual period was 07/13/2021 (approximate).  Paramter Normal / Abnormal Prsent  Frequency Amenoorhea     Infrequent (>38 days)     Normal (?24 days ?38 days) X   Freequent (<24 days)    Duration Normal (?8 days) X   Prolonged (>8 days)    Regularity Regular (shortest to longest cycle variation ?7-9 days)* X   Irregular (shortest to longest cycle variation ?8-10days)*    Flow Volume Light    (Self reported) Normal     Heavy X      Intermenstrual Bleeding None X   Random     Cyclical early     Cyclical mid     Cyclical late        Unscheduled Bleeding  Not applicable X  (exogenous hormones)  Absent     Present     FIGO AUB I System: *The available evidence suggests that, using these criteria, the normal range (shortest to longest) varies with age: 59-25 y of age, ?63 d; 34-41 y, ?7 d; and for 63-45 y, ?9 d    Review of Systems: Review of Systems  Constitutional: Negative.   Gastrointestinal:  Positive for abdominal pain. Negative for constipation, diarrhea, nausea and vomiting.  Genitourinary: Negative.    Past Medical History:  Patient Active Problem List   Diagnosis Date Noted   Chest pain 06/18/2015    Past Surgical History:  Past Surgical History:  Procedure Laterality Date   NO PAST SURGERIES      Obstetric History: G0P0000  Family History:  Family History  Problem Relation Age of Onset   Cancer Maternal Grandfather    Breast cancer Neg Hx    Ovarian cancer Neg Hx     Social History:  Social History   Socioeconomic History   Marital status: Single    Spouse name: Not on file   Number of children: Not on file   Years of education: Not on file   Highest education level: Not on file  Occupational History   Not on file  Tobacco Use   Smoking status: Never   Smokeless tobacco:  Never  Vaping Use   Vaping Use: Never used  Substance and Sexual Activity   Alcohol use: No   Drug use: Yes    Types: Marijuana   Sexual activity: Yes    Birth control/protection: None  Other Topics Concern   Not on file  Social History Narrative   Not on file   Social Determinants of Health   Financial Resource Strain: Not on file  Food Insecurity: Not on file  Transportation Needs: Not on file  Physical Activity: Not on file  Stress: Not on file  Social Connections: Not on file  Intimate Partner Violence: Not on file    Allergies:  No Known Allergies  Medications: Prior to Admission medications   Medication Sig Start Date End Date Taking? Authorizing Provider  clindamycin (CLEOCIN T) 1 % external solution Apply topically daily. 06/04/21 06/04/22 Yes  Deirdre Evener, MD  norelgestromin-ethinyl estradiol [facility]) 150-35 MCG/24HR transdermal patch Place 1 patch onto the skin once a week. Then leave off for 1 week before resuming active patches 07/22/21  Yes Vena [place], MD  Fe Fum-Fe Poly-Vit C-Lactobac (FUSION) 65-65-25-30 MG CAPS TAKE 1 CAPSULE BY MOUTH EVERY DAY Patient not taking: Reported on 06/17/2021 01/14/21   Vena [place], MD    Physical Exam Blood pressure (!) 116/55, height 5\' 3"  (1.6 m), weight 150 lb (68 kg), last menstrual period 07/13/2021.  Patient's last menstrual period was 07/13/2021 (approximate).  General: NAD HEENT: normocephalic, anicteric Pulmonary: No increased work of breathing Genitourinary:  External: Normal external female genitalia.  Normal urethral meatus, normal Bartholin's and Skene's glands.    Vagina: Normal vaginal mucosa, no evidence of prolapse.    Cervix: Grossly normal in appearance, no bleeding  Rectal: deferred  Lymphatic: no evidence of inguinal lymphadenopathy Extremities: no edema, erythema, or tenderness Neurologic: Grossly intact Psychiatric: mood appropriate, affect full  Female chaperone present for pelvic portions of the physical exam  Both abdominal and transvaginal ultrasound of the pelvis was undertaken showing normal ovaries bilaterally with normal doppler flow and cysts.  Uterus non-enalarged anteverted with endometrial stripe non-thickened  Assessment: 20 y.o. G0P0000 with abnormal uterine bleeding  Plan: Problem List Items Addressed This Visit   None Visit Diagnoses     Screening for malignant neoplasm of cervix    -  Primary   Relevant Orders   Cytology - PAP   Routine screening for STI (sexually transmitted infection)       Relevant Orders   Cytology - PAP       1) Discussed management options for abnormal uterine bleeding.  She was previously prescribed contraceptive patch but did not start.  She is interested in starting at present to lighten  menstrual flow.  Bedside ultrasound today shows normal ovaries bilaterally.  Will assess if improvement in menstrual cycle with addition of patch  2) Healthcare maintenance - pap obtained  3) Return in about 8 weeks (around 09/16/2021) for medication follow up (can be phone).   09/18/2021, MD, [facility] Austria [facility], [facility] 07/22/2021, 10:32 AM

## 2021-07-23 LAB — CYTOLOGY - PAP
Chlamydia: NEGATIVE
Comment: NEGATIVE
Comment: NORMAL
Diagnosis: NEGATIVE
Neisseria Gonorrhea: NEGATIVE

## 2021-09-03 ENCOUNTER — Other Ambulatory Visit: Payer: Self-pay

## 2021-09-03 ENCOUNTER — Ambulatory Visit (INDEPENDENT_AMBULATORY_CARE_PROVIDER_SITE_OTHER): Payer: Medicaid Other | Admitting: Obstetrics and Gynecology

## 2021-09-03 ENCOUNTER — Encounter: Payer: Self-pay | Admitting: Obstetrics and Gynecology

## 2021-09-03 DIAGNOSIS — N939 Abnormal uterine and vaginal bleeding, unspecified: Secondary | ICD-10-CM

## 2021-09-03 DIAGNOSIS — Z3045 Encounter for surveillance of transdermal patch hormonal contraceptive device: Secondary | ICD-10-CM | POA: Diagnosis not present

## 2021-09-03 DIAGNOSIS — D500 Iron deficiency anemia secondary to blood loss (chronic): Secondary | ICD-10-CM

## 2021-09-03 DIAGNOSIS — Z113 Encounter for screening for infections with a predominantly sexual mode of transmission: Secondary | ICD-10-CM | POA: Diagnosis not present

## 2021-09-03 MED ORDER — XULANE 150-35 MCG/24HR TD PTWK: 1.0000 | MEDICATED_PATCH | TRANSDERMAL | 3 refills | Status: DC

## 2021-09-03 MED ORDER — FERROUS SULFATE 325 (65 FE) MG PO TABS: 325.0000 mg | ORAL_TABLET | Freq: Two times a day (BID) | ORAL | 1 refills | Status: DC

## 2021-09-03 NOTE — Progress Notes (Signed)
I connected with Kristi Herrera on 09/03/2021 at  9:50 AM EDT by telephone and verified that I am speaking with the correct person using two identifiers.   I discussed the limitations, risks, security and privacy concerns of performing an evaluation and management service by telephone and the availability of in person appointments. I also discussed with the patient that there may be a patient responsible charge related to this service. The patient expressed understanding and agreed to proceed.  The patient was at home I spoke with the patient from my workstation phone The names of people involved in this encounter were: Kristi Herrera , and Vena Austria   Obstetrics & Gynecology Office Visit   Chief Complaint:  Chief Complaint  Patient presents with   Follow-up    Phone visit - F/U medication, doing great on OCP.     History of Present Illness: 20 y.o. G0P0000 presenting for medication follow up for a diagnosis of  dysmenorrhea and AUB .  She is currently being managed with Xulane patch.   The patient reports good control of symptoms on her current regimen.  On her current medication regimen  has not regular menstrual cycles, improvement in dysmenorrhea, and AUB .   She has not noted any side-effects or new symptoms.    Review of Systems:  Review of Systems  Constitutional: Negative.   Gastrointestinal:  Negative for abdominal pain.  Genitourinary: Negative.   Neurological:  Negative for headaches.    Past Medical History:  Past Medical History:  Diagnosis Date   Anemia    No known health problems     Past Surgical History:  Past Surgical History:  Procedure Laterality Date   NO PAST SURGERIES      Gynecologic History: Patient's last menstrual period was 08/18/2021.  Obstetric History: G0P0000  Family History:  Family History  Problem Relation Age of Onset   Cancer Maternal Grandfather    Breast cancer Neg Hx    Ovarian cancer Neg Hx     Social History:   Social History   Socioeconomic History   Marital status: Single    Spouse name: Not on file   Number of children: Not on file   Years of education: Not on file   Highest education level: Not on file  Occupational History   Not on file  Tobacco Use   Smoking status: Never   Smokeless tobacco: Never  Vaping Use   Vaping Use: Never used  Substance and Sexual Activity   Alcohol use: No   Drug use: Yes    Types: Marijuana   Sexual activity: Yes    Birth control/protection: Patch  Other Topics Concern   Not on file  Social History Narrative   Not on file   Social Determinants of Health   Financial Resource Strain: Not on file  Food Insecurity: Not on file  Transportation Needs: Not on file  Physical Activity: Not on file  Stress: Not on file  Social Connections: Not on file  Intimate Partner Violence: Not on file    Allergies:  No Known Allergies  Medications: Prior to Admission medications   Medication Sig Start Date End Date Taking? Authorizing Provider  clindamycin (CLEOCIN T) 1 % external solution Apply topically daily. 06/04/21 06/04/22 Yes Deirdre Evener, MD  norelgestromin-ethinyl estradiol [facility]) 150-35 MCG/24HR transdermal patch Place 1 patch onto the skin once a week. Then leave off for 1 week before resuming active patches 07/22/21  Yes Vena [place], MD  Fe  Fum-Fe Poly-Vit C-Lactobac (FUSION) 65-65-25-30 MG CAPS TAKE 1 CAPSULE BY MOUTH EVERY DAY Patient not taking: Reported on 06/17/2021 01/14/21   Vena [place], MD    Physical Exam Vitals: There were no vitals filed for this visit. Patient's last menstrual period was 08/18/2021.  No physical exam as this was a remote telephone visit to promote social distancing during the current COVID-19 Pandemic  Assessment: 20 y.o. G0P0000 medication follow up  Plan: Problem List Items Addressed This Visit   None Visit Diagnoses     Routine screening for STI (sexually transmitted infection)    -  Primary    Relevant Orders   HEP, RPR, HIV Panel   Encounter for surveillance of transdermal patch hormonal contraceptive device       Abnormal uterine bleeding       Iron deficiency anemia due to chronic blood loss       Relevant Medications   ferrous sulfate (FERROUSUL) 325 (65 FE) MG tablet      1) STI screening - patient interested in coming in for STI testing, orders placed  2) AUB/Dysmenorrhea - Rx Xulane patch good response to starting, continue  3) Anemia - Rx ferrous sulfate  4) Telephone time 7:63min  5) Return in about 1 year (around 09/03/2022) for labs sometime in the next weeks, annual 1 year.   Vena Austria, MD, [facility], [facility] 09/03/2021, 9:58 AM  ,

## 2021-09-22 ENCOUNTER — Ambulatory Visit: Payer: Medicaid Other | Admitting: Dermatology

## 2022-01-29 ENCOUNTER — Ambulatory Visit: Payer: Medicaid Other | Admitting: Obstetrics

## 2022-02-17 ENCOUNTER — Ambulatory Visit: Payer: Medicaid Other | Admitting: Obstetrics

## 2022-03-19 ENCOUNTER — Ambulatory Visit (INDEPENDENT_AMBULATORY_CARE_PROVIDER_SITE_OTHER): Payer: Medicaid Other | Admitting: Obstetrics

## 2022-03-19 ENCOUNTER — Ambulatory Visit: Payer: Medicaid Other | Admitting: Obstetrics

## 2022-03-19 ENCOUNTER — Other Ambulatory Visit (HOSPITAL_COMMUNITY)
Admission: RE | Admit: 2022-03-19 | Discharge: 2022-03-19 | Disposition: A | Discharge status: Home / Self Care | Payer: Medicaid Other | Source: Ambulatory Visit | Attending: Obstetrics | Admitting: Obstetrics

## 2022-03-19 ENCOUNTER — Encounter: Payer: Self-pay | Admitting: Obstetrics

## 2022-03-19 VITALS — BP 120/60 | Ht 63.0 in | Wt 165.0 lb

## 2022-03-19 DIAGNOSIS — Z0000 Encounter for general adult medical examination without abnormal findings: Secondary | ICD-10-CM

## 2022-03-19 DIAGNOSIS — Z113 Encounter for screening for infections with a predominantly sexual mode of transmission: Secondary | ICD-10-CM | POA: Diagnosis present

## 2022-03-19 DIAGNOSIS — Z3009 Encounter for other general counseling and advice on contraception: Secondary | ICD-10-CM | POA: Diagnosis not present

## 2022-03-19 DIAGNOSIS — Z01419 Encounter for gynecological examination (general) (routine) without abnormal findings: Secondary | ICD-10-CM
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
## 2022-03-20 LAB — CBC WITH DIFFERENTIAL
Basophils Absolute: 0.1 10*3/uL (ref 0.0–0.2)
Basos: 1 %
EOS (ABSOLUTE): 0.1 10*3/uL (ref 0.0–0.4)
Eos: 1 %
Hematocrit: 38.2 % (ref 34.0–46.6)
Hemoglobin: 12.2 g/dL (ref 11.1–15.9)
Immature Grans (Abs): 0 10*3/uL (ref 0.0–0.1)
Immature Granulocytes: 0 %
Lymphocytes Absolute: 2.6 10*3/uL (ref 0.7–3.1)
Lymphs: 39 %
MCH: 28.5 pg (ref 26.6–33.0)
MCHC: 31.9 g/dL (ref 31.5–35.7)
MCV: 89 fL (ref 79–97)
Monocytes Absolute: 0.5 10*3/uL (ref 0.1–0.9)
Monocytes: 8 %
Neutrophils Absolute: 3.3 10*3/uL (ref 1.4–7.0)
Neutrophils: 51 %
RBC: 4.28 x10E6/uL (ref 3.77–5.28)
RDW: 13.6 % (ref 11.7–15.4)
WBC: 6.5 10*3/uL (ref 3.4–10.8)

## 2022-03-20 LAB — HEP, RPR, HIV PANEL
HIV Screen 4th Generation wRfx: NONREACTIVE
Hepatitis B Surface Ag: NEGATIVE
RPR Ser Ql: NONREACTIVE

## 2022-03-23 ENCOUNTER — Telehealth: Payer: Self-pay

## 2022-03-23 ENCOUNTER — Encounter: Payer: Self-pay | Admitting: Obstetrics

## 2022-03-23 ENCOUNTER — Other Ambulatory Visit: Payer: Self-pay | Admitting: Obstetrics

## 2022-03-23 DIAGNOSIS — N760 Acute vaginitis: Secondary | ICD-10-CM

## 2022-03-23 LAB — CERVICOVAGINAL ANCILLARY ONLY
Bacterial Vaginitis (gardnerella): POSITIVE — AB
Chlamydia: NEGATIVE
Comment: NEGATIVE
Comment: NEGATIVE
Comment: NEGATIVE
Comment: NORMAL
Neisseria Gonorrhea: NEGATIVE
Trichomonas: NEGATIVE

## 2022-03-23 MED ORDER — METRONIDAZOLE 500 MG PO TABS: 500.0000 mg | ORAL_TABLET | Freq: Two times a day (BID) | ORAL | 0 refills | Status: AC

## 2022-03-23 NOTE — Telephone Encounter (Signed)
Pt Labs positive for BV, she needs antibiotics sent to [facility] please  ?

## 2022-03-24 NOTE — Telephone Encounter (Signed)
Pt aware.

## 2022-03-24 NOTE — Progress Notes (Signed)
+   BV noted from aptima swab. Rx for Metronidazole sent to patient's pharmacy and she is notified of the results and treatment. ? ?Mirna Mires, CNM  ?03/24/2022 9:07 PM  ? ?

## 2022-06-15 ENCOUNTER — Ambulatory Visit: Payer: Medicaid Other | Admitting: Obstetrics

## 2022-07-05 ENCOUNTER — Encounter: Payer: Self-pay | Admitting: Obstetrics

## 2022-07-06 ENCOUNTER — Ambulatory Visit: Payer: Medicaid Other | Admitting: Obstetrics

## 2022-07-15 ENCOUNTER — Encounter: Payer: Self-pay | Admitting: Obstetrics & Gynecology

## 2022-07-15 ENCOUNTER — Ambulatory Visit (INDEPENDENT_AMBULATORY_CARE_PROVIDER_SITE_OTHER): Payer: Medicaid Other | Admitting: Obstetrics & Gynecology

## 2022-07-15 ENCOUNTER — Other Ambulatory Visit (HOSPITAL_COMMUNITY)
Admission: RE | Admit: 2022-07-15 | Discharge: 2022-07-15 | Disposition: A | Discharge status: Home / Self Care | Payer: Medicaid Other | Source: Ambulatory Visit | Attending: Obstetrics & Gynecology | Admitting: Obstetrics & Gynecology

## 2022-07-15 VITALS — BP 104/50 | Ht 63.0 in | Wt 158.6 lb

## 2022-07-15 DIAGNOSIS — Z113 Encounter for screening for infections with a predominantly sexual mode of transmission: Secondary | ICD-10-CM | POA: Diagnosis present

## 2022-07-16 LAB — HEP, RPR, HIV PANEL
HIV Screen 4th Generation wRfx: NONREACTIVE
Hepatitis B Surface Ag: NEGATIVE
RPR Ser Ql: NONREACTIVE

## 2022-07-19 ENCOUNTER — Encounter: Payer: Self-pay | Admitting: Obstetrics

## 2022-07-19 ENCOUNTER — Other Ambulatory Visit: Payer: Self-pay | Admitting: Obstetrics

## 2022-07-19 LAB — CERVICOVAGINAL ANCILLARY ONLY
Bacterial Vaginitis (gardnerella): POSITIVE — AB
Candida Glabrata: NEGATIVE
Candida Vaginitis: NEGATIVE
Chlamydia: NEGATIVE
Comment: NEGATIVE
Comment: NEGATIVE
Comment: NEGATIVE
Comment: NEGATIVE
Comment: NEGATIVE
Comment: NORMAL
Neisseria Gonorrhea: NEGATIVE
Trichomonas: NEGATIVE

## 2022-07-19 MED ORDER — METRONIDAZOLE 500 MG PO TABS: 500.0000 mg | ORAL_TABLET | Freq: Two times a day (BID) | ORAL | 0 refills | Status: AC

## 2022-07-19 NOTE — Progress Notes (Addendum)
Patient sen by Dr. Eugenie Norrie for Sti testing. She is + for BV. I have sent in a script for Metronidazole for her.  Mirna Mires, CNM  07/19/2022 9:19 PM

## 2022-08-03 ENCOUNTER — Ambulatory Visit: Payer: Medicaid Other

## 2022-08-03 NOTE — Progress Notes (Deleted)
SUBJECTIVE:  21 y.o. female complains of {pe vag discharge desc:315065} vaginal discharge for *** {gen duration:315003}. Denies abnormal vaginal bleeding or significant pelvic pain or fever. No UTI symptoms. Denies history of known exposure to STD.  Patient's last menstrual period was 07/05/2022.  OBJECTIVE:  She appears well, afebrile. Abdomen: benign, soft, nontender, no masses. Pelvic Exam: {pelvic exam:315900}. Urine dipstick: {ua dip:315113}.  ASSESSMENT:  {vaginitis type:315262}  PLAN:  GC and chlamydia DNA  probe sent to lab. Treatment: {vaginitis tx:315263} ROV prn if symptoms persist or worsen.

## 2022-08-07 NOTE — Progress Notes (Signed)
Subjective:     Kristi Herrera is a 21 y.o. female here for a routine exam.  Current complaints: STD screening.  Personal health questionnaire reviewed: yes.   Gynecologic History Patient's last menstrual period was 07/05/2022. Contraception: condoms Last Pap: none.   Obstetric History OB History  Gravida Para Term Preterm AB Living  0 0 0 0 0 0  SAB IAB Ectopic Multiple Live Births  0 0 0 0 0     The following portions of the patient's history were reviewed and updated as appropriate: allergies, current medications, past family history, past medical history, past social history, past surgical history, and problem list.  Review of Systems A comprehensive review of systems was negative.    Objective:    BP (!) 104/50   Ht 5\' 3"  (1.6 m)   Wt 158 lb 9.6 oz (71.9 kg)   LMP 07/05/2022   BMI 28.09 kg/m  General appearance: alert, cooperative, and no distress Abdomen: soft, non-tender; bowel sounds normal; no masses,  no organomegaly Pelvic: cervix normal in appearance, external genitalia normal, no adnexal masses or tenderness, no cervical motion tenderness, uterus normal size, shape, and consistency, and vagina normal without discharge Extremities: extremities normal, atraumatic, no cyanosis or edema Skin: Skin color, texture, turgor normal. No rashes or lesions    Assessment:  Cervicovaginal  ancillary HEP, RPR, HIV Panel  Healthy female exam.    Plan:    RTO as scheduled     09/04/2022, MD  08/07/2022 11:47 AM

## 2022-08-20 ENCOUNTER — Ambulatory Visit (INDEPENDENT_AMBULATORY_CARE_PROVIDER_SITE_OTHER): Payer: Medicaid Other

## 2022-08-20 ENCOUNTER — Other Ambulatory Visit (HOSPITAL_COMMUNITY)
Admission: RE | Admit: 2022-08-20 | Discharge: 2022-08-20 | Disposition: A | Discharge status: Home / Self Care | Payer: Medicaid Other | Source: Ambulatory Visit | Attending: Obstetrics & Gynecology | Admitting: Obstetrics & Gynecology

## 2022-08-20 VITALS — BP 100/60 | Ht 63.0 in | Wt 159.0 lb

## 2022-08-20 DIAGNOSIS — B9689 Other specified bacterial agents as the cause of diseases classified elsewhere: Secondary | ICD-10-CM | POA: Diagnosis present

## 2022-08-20 DIAGNOSIS — N760 Acute vaginitis: Secondary | ICD-10-CM | POA: Insufficient documentation

## 2022-08-20 NOTE — Progress Notes (Signed)
Patient came in for self swab on nurse schedule to retest for BV, make sure it cleared up from last visit. She is not having any vaginal symptoms and denies STD testing today.

## 2022-08-21 LAB — CERVICOVAGINAL ANCILLARY ONLY
Bacterial Vaginitis (gardnerella): POSITIVE — AB
Comment: NEGATIVE

## 2022-08-23 ENCOUNTER — Other Ambulatory Visit: Payer: Self-pay

## 2022-08-23 ENCOUNTER — Ambulatory Visit (INDEPENDENT_AMBULATORY_CARE_PROVIDER_SITE_OTHER): Payer: Medicaid Other

## 2022-08-23 ENCOUNTER — Encounter: Payer: Self-pay | Admitting: Emergency Medicine

## 2022-08-23 ENCOUNTER — Ambulatory Visit
Admission: EM | Admit: 2022-08-23 | Discharge: 2022-08-23 | Disposition: A | Discharge status: Home / Self Care | Payer: Medicaid Other | Attending: Internal Medicine | Admitting: Internal Medicine

## 2022-08-23 DIAGNOSIS — S46912A Strain of unspecified muscle, fascia and tendon at shoulder and upper arm level, left arm, initial encounter: Secondary | ICD-10-CM | POA: Diagnosis not present

## 2022-08-23 DIAGNOSIS — S29011A Strain of muscle and tendon of front wall of thorax, initial encounter: Secondary | ICD-10-CM | POA: Diagnosis not present

## 2022-08-23 DIAGNOSIS — M25512 Pain in left shoulder: Secondary | ICD-10-CM | POA: Diagnosis not present

## 2022-08-23 DIAGNOSIS — S46812A Strain of other muscles, fascia and tendons at shoulder and upper arm level, left arm, initial encounter: Secondary | ICD-10-CM

## 2022-08-23 MED ORDER — METRONIDAZOLE 500 MG PO TABS
500.0000 mg | ORAL_TABLET | Freq: Two times a day (BID) | ORAL | 0 refills | Status: AC
Start: 2022-08-23 — End: 2022-08-30

## 2022-08-23 MED ORDER — CYCLOBENZAPRINE HCL 10 MG PO TABS: 10.0000 mg | ORAL_TABLET | Freq: Three times a day (TID) | ORAL | 0 refills | Status: DC
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
## 2022-08-23 NOTE — Discharge Instructions (Addendum)
You may take Ibuprofen up to 800 mg three times a day for pain and inflammation Follow up with orthopedics if you are not getting better in one week  Continue the heat and stretches
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
## 2022-11-04 ENCOUNTER — Encounter: Payer: Self-pay | Admitting: Obstetrics

## 2022-12-11 ENCOUNTER — Encounter: Payer: Self-pay | Admitting: Obstetrics

## 2022-12-15 ENCOUNTER — Ambulatory Visit: Payer: Medicaid Other | Admitting: Obstetrics & Gynecology

## 2022-12-27 ENCOUNTER — Ambulatory Visit: Payer: Medicaid Other | Admitting: Obstetrics

## 2022-12-29 ENCOUNTER — Encounter: Payer: Self-pay | Admitting: Obstetrics

## 2022-12-29 ENCOUNTER — Ambulatory Visit (INDEPENDENT_AMBULATORY_CARE_PROVIDER_SITE_OTHER): Payer: Medicaid Other | Admitting: Obstetrics

## 2022-12-29 ENCOUNTER — Other Ambulatory Visit (HOSPITAL_COMMUNITY)
Admission: RE | Admit: 2022-12-29 | Discharge: 2022-12-29 | Disposition: A | Discharge status: Home / Self Care | Payer: Medicaid Other | Source: Ambulatory Visit | Attending: Obstetrics & Gynecology | Admitting: Obstetrics & Gynecology

## 2022-12-29 VITALS — BP 126/87 | HR 86 | Ht 63.0 in | Wt 150.0 lb

## 2022-12-29 DIAGNOSIS — B851 Pediculosis due to Pediculus humanus corporis: Secondary | ICD-10-CM | POA: Insufficient documentation

## 2022-12-29 DIAGNOSIS — Z113 Encounter for screening for infections with a predominantly sexual mode of transmission: Secondary | ICD-10-CM | POA: Insufficient documentation

## 2022-12-29 DIAGNOSIS — N898 Other specified noninflammatory disorders of vagina: Secondary | ICD-10-CM | POA: Diagnosis not present

## 2022-12-29 DIAGNOSIS — L720 Epidermal cyst: Secondary | ICD-10-CM

## 2022-12-29 NOTE — Progress Notes (Addendum)
GYN ENCOUNTER  Subjective  HPI: Kristi Herrera is a 22 y.o. G0P0000 who presents today for evaluation of vaginal discharge and a cyst on her labia. She reports that she has had white discharge without odor or itching after her periods. This has been happening for several months and is different from her normal discharge. She also reports a bump on her labia that has increased in size. She denies pain or discharge from the bump. She reports that it has occurred in the past. She would also like routine STI testing.  Past Medical History:  Diagnosis Date   Anemia    No known health problems    Past Surgical History:  Procedure Laterality Date   NO PAST SURGERIES     OB History     Gravida  0   Para  0   Term  0   Preterm  0   AB  0   Living  0      SAB  0   IAB  0   Ectopic  0   Multiple  0   Live Births  0          No Known Allergies  ROS: Pertinent items noted in HPI  Objective  BP 126/87   Pulse 86   Ht 5\' 3"  (1.6 m)   Wt 150 lb (68 kg)   LMP 12/11/2022   BMI 26.57 kg/m   Physical examination Pelvic exam: VULVA: normal appearing vulva without lesions, erythema, or tenderness. Firm, mobile 1 cm mass palpable in right labia. White, curdlike discharge noted on perineum and labia.  Assessment 1) Epidermoid cyst 2) Likely yeast 3) Desires STI testing  Plan 1) Discussed findings. If it becomes red or painful, consider I&D or antibiotics. Expect to resolve spontaneously. 2) Swab collected. Discussed OTC Monistat or Diflucan if positive for yeast. 3) STI swabs and blood work collected.  RTC for annual or PRN.  Lloyd Huger, CNM

## 2022-12-30 ENCOUNTER — Encounter: Payer: Self-pay | Admitting: Obstetrics

## 2022-12-30 ENCOUNTER — Other Ambulatory Visit: Payer: Self-pay | Admitting: Obstetrics

## 2022-12-30 LAB — HEPATITIS B SURFACE ANTIBODY,QUALITATIVE: Hep B Surface Ab, Qual: NONREACTIVE

## 2022-12-30 LAB — CERVICOVAGINAL ANCILLARY ONLY
Bacterial Vaginitis (gardnerella): POSITIVE — AB
Candida Glabrata: NEGATIVE
Candida Vaginitis: POSITIVE — AB
Chlamydia: NEGATIVE
Comment: NEGATIVE
Comment: NEGATIVE
Comment: NEGATIVE
Comment: NEGATIVE
Comment: NEGATIVE
Comment: NORMAL
Neisseria Gonorrhea: POSITIVE — AB
Trichomonas: NEGATIVE

## 2022-12-30 LAB — HIV ANTIBODY (ROUTINE TESTING W REFLEX): HIV Screen 4th Generation wRfx: NONREACTIVE

## 2022-12-30 LAB — RPR: RPR Ser Ql: NONREACTIVE

## 2022-12-30 LAB — HEPATITIS B SURFACE ANTIGEN: Hepatitis B Surface Ag: NEGATIVE

## 2022-12-30 LAB — HEPATITIS C ANTIBODY: Hep C Virus Ab: NONREACTIVE

## 2022-12-30 MED ORDER — FLUCONAZOLE 150 MG PO TABS: 150.0000 mg | ORAL_TABLET | Freq: Once | ORAL | 0 refills | Status: AC

## 2022-12-30 MED ORDER — METRONIDAZOLE 500 MG PO TABS: 500.0000 mg | ORAL_TABLET | Freq: Two times a day (BID) | ORAL | 0 refills | Status: DC

## 2022-12-30 NOTE — Progress Notes (Signed)
+  BV, yeast, and gonorrhea. Rx for metronidazole and Diflucan sent to pharmacy. [facility] notified for need for treatment of herself and partner with ceftriaxone and advised to come in for nurse-only visit.  M. Norberto Sorenson, CNM

## 2022-12-31 ENCOUNTER — Ambulatory Visit (INDEPENDENT_AMBULATORY_CARE_PROVIDER_SITE_OTHER): Payer: Medicaid Other

## 2022-12-31 VITALS — BP 120/80 | Ht 63.0 in | Wt 152.0 lb

## 2022-12-31 DIAGNOSIS — A549 Gonococcal infection, unspecified: Secondary | ICD-10-CM

## 2022-12-31 MED ORDER — CEFTRIAXONE SODIUM 500 MG IJ SOLR
500.0000 mg | Freq: Once | INTRAMUSCULAR | Status: AC
  Administered 2022-12-31: 500 mg via INTRAMUSCULAR

## 2022-12-31 NOTE — Progress Notes (Signed)
Pt came in for her Rocephin shot. Gonorrhea    No intercourse for 7 days or protected intercourse if within 7 days (use condoms).   Encourage condom use   Partner needs treatment   Schedule appointment with provider for 4 weeks for test of cure   Documentation to [facility]. Yes- 12/31/22  Treatment:   For patients weighing less than 300 lbs, 500 mg Rocephin (ceftriaxone) IM single dose   For patient weighing > 300 lbs, 1 gram Rochephin (ceftriaxone) IM single dose

## 2023-02-04 ENCOUNTER — Ambulatory Visit: Payer: Medicaid Other

## 2023-02-04 NOTE — Progress Notes (Deleted)
    NURSE VISIT NOTE  Subjective:    Patient ID: Loistine Eberlin, female    DOB: December 28, 2000, 22 y.o.   MRN: [id number]  HPI  Patient is a 22 y.o. G0P0000 female who presents for TOC {pe vag discharge desc:315065} vaginal discharge for *** {gen duration:315003}. Denies abnormal vaginal bleeding or significant pelvic pain or fever. {Actions; denies/reports/admits to:19208} {UTI Symptoms:210800002}. Patient {has/denies:315300} history of known exposure to STD.   Objective:    There were no vitals taken for this visit.   @THIS  VISIT ONLY@  Assessment:   No diagnosis found.  {vaginitis type:315262}  Plan:   GC and chlamydia DNA  probe sent to lab. Treatment: {vaginitis tx:315263} ROV prn if symptoms persist or worsen.   Minette Headland, CMA

## 2023-02-11 ENCOUNTER — Ambulatory Visit (INDEPENDENT_AMBULATORY_CARE_PROVIDER_SITE_OTHER): Payer: Medicaid Other

## 2023-02-11 ENCOUNTER — Other Ambulatory Visit (HOSPITAL_COMMUNITY)
Admission: RE | Admit: 2023-02-11 | Discharge: 2023-02-11 | Disposition: A | Discharge status: Home / Self Care | Payer: Medicaid Other | Source: Ambulatory Visit | Attending: Obstetrics & Gynecology | Admitting: Obstetrics & Gynecology

## 2023-02-11 VITALS — BP 117/71 | HR 94 | Ht 63.0 in | Wt 151.0 lb

## 2023-02-11 DIAGNOSIS — Z113 Encounter for screening for infections with a predominantly sexual mode of transmission: Secondary | ICD-10-CM | POA: Insufficient documentation

## 2023-02-11 NOTE — Progress Notes (Unsigned)
    NURSE VISIT NOTE  Subjective:    Patient ID: Kristi Herrera, female    DOB: [birth year], 22 y.o.   MRN: [id number]  HPI  Patient is a 22 y.o. G0P0000 female who presents for treatment of care. Swab ordered and sent to lab. Pt states she did finish medication as well as her partner did too.  Objective:    Ht 5\' 3"  (1.6 m)   Wt 151 lb (68.5 kg)   BMI 26.75 kg/m      Assessment:   1. Screen for STD (sexually transmitted disease)     Plan:   GC and chlamydia DNA  probe sent to lab. Treatment: abstain from coitus during course of treatment ROV prn if symptoms persist or worsen.    Landis Gandy, [place]

## 2023-02-15 ENCOUNTER — Other Ambulatory Visit: Payer: Self-pay | Admitting: Obstetrics

## 2023-02-15 LAB — CERVICOVAGINAL ANCILLARY ONLY
Bacterial Vaginitis (gardnerella): NEGATIVE
Candida Glabrata: NEGATIVE
Candida Vaginitis: POSITIVE — AB
Chlamydia: POSITIVE — AB
Comment: NEGATIVE
Comment: NEGATIVE
Comment: NEGATIVE
Comment: NEGATIVE
Comment: NEGATIVE
Comment: NORMAL
Neisseria Gonorrhea: NEGATIVE
Trichomonas: POSITIVE — AB

## 2023-02-15 MED ORDER — AZITHROMYCIN 500 MG PO TABS: 2000.0000 mg | ORAL_TABLET | Freq: Once | ORAL | 1 refills | Status: AC

## 2023-02-15 MED ORDER — FLUCONAZOLE 150 MG PO TABS: 150.0000 mg | ORAL_TABLET | ORAL | 0 refills | Status: DC

## 2023-02-15 MED ORDER — METRONIDAZOLE 500 MG PO TABS: 500.0000 mg | ORAL_TABLET | Freq: Two times a day (BID) | ORAL | 0 refills | Status: DC

## 2023-02-16 ENCOUNTER — Other Ambulatory Visit: Payer: Self-pay

## 2023-02-16 DIAGNOSIS — Z113 Encounter for screening for infections with a predominantly sexual mode of transmission: Secondary | ICD-10-CM

## 2023-02-16 MED ORDER — AZITHROMYCIN 500 MG PO TABS: 2000.0000 mg | ORAL_TABLET | Freq: Once | ORAL | 0 refills | Status: AC

## 2023-02-16 MED ORDER — FLUCONAZOLE 150 MG PO TABS: 150.0000 mg | ORAL_TABLET | ORAL | 0 refills | Status: AC

## 2023-02-16 NOTE — Telephone Encounter (Signed)
The patient is calling needing to speak with you about her prescriptions, please advise?

## 2023-02-16 NOTE — Telephone Encounter (Signed)
Spoke to patient today and have fixed medication she is aware pharmacy is aware, she has received medication today.

## 2023-03-28 ENCOUNTER — Ambulatory Visit: Payer: Medicaid Other | Admitting: Obstetrics and Gynecology

## 2023-05-17 ENCOUNTER — Ambulatory Visit: Payer: Medicaid Other | Admitting: Obstetrics and Gynecology

## 2023-05-23 NOTE — Progress Notes (Unsigned)
PCP:  [name], MD   No chief complaint on file.    HPI:      [name] is a 22 y.o. G0P0000 who LMP was No LMP recorded., presents today for her annual examination.  Her menses are regular every 28-30 days, lasting 5-7 days.  Dysmenorrhea mild, occurring first 1-2 days of flow. She does not have intermenstrual bleeding.  Sex activity: not current active, has been in past. Did xulane in past. Declines BC for now. Last Pap: 07/22/21 Results were no abnormalities Hx of STDs: gonorrhea 1/24 with neg TOC 3/24; diagnosed with chlamydia 3/24, questio TOC?? Hx of BV and yeast on culture.  There is no FH of breast cancer. There is no FH of ovarian cancer. The patient does not do self-breast exams.  Tobacco use: The patient denies current or previous tobacco use. Alcohol use: none No drug use.  Exercise: moderately active  She does not get adequate calcium and Vitamin D in her diet. Gardasil not done  Past Medical History:  Diagnosis Date   Anemia    No known health problems     Past Surgical History:  Procedure Laterality Date   NO PAST SURGERIES      Family History  Problem Relation Age of Onset   Cancer Maternal Grandfather    Breast cancer Neg Hx    Ovarian cancer Neg Hx     Social History   Socioeconomic History   Marital status: Single    Spouse name: Not on file   Number of children: Not on file   Years of education: Not on file   Highest education level: Not on file  Occupational History   Not on file  Tobacco Use   Smoking status: Never   Smokeless tobacco: Never  Vaping Use   Vaping Use: Never used  Substance and Sexual Activity   Alcohol use: No   Drug use: Yes    Types: Marijuana   Sexual activity: Yes    Birth control/protection: Patch  Other Topics Concern   Not on file  Social History Narrative   Not on file   Social Determinants of Health   Financial Resource Strain: Not on file  Food Insecurity: Not on file   Transportation Needs: Not on file  Physical Activity: Not on file  Stress: Not on file  Social Connections: Not on file  Intimate Partner Violence: Not on file     Current Outpatient Medications:    metroNIDAZOLE (FLAGYL) 500 MG tablet, Take 1 tablet (500 mg total) by mouth 2 (two) times daily., Disp: 14 tablet, Rfl: 0     ROS:  Review of Systems  Constitutional:  Negative for fatigue, fever and unexpected weight change.  Respiratory:  Negative for cough, shortness of breath and wheezing.   Cardiovascular:  Negative for chest pain, palpitations and leg swelling.  Gastrointestinal:  Negative for blood in stool, constipation, diarrhea, nausea and vomiting.  Endocrine: Negative for cold intolerance, heat intolerance and polyuria.  Genitourinary:  Negative for dyspareunia, dysuria, flank pain, frequency, genital sores, hematuria, menstrual problem, pelvic pain, urgency, vaginal bleeding, vaginal discharge and vaginal pain.  Musculoskeletal:  Negative for back pain, joint swelling and myalgias.  Skin:  Negative for rash.  Neurological:  Negative for dizziness, syncope, light-headedness, numbness and headaches.  Hematological:  Negative for adenopathy.  Psychiatric/Behavioral:  Negative for agitation, confusion, sleep disturbance and suicidal ideas. The patient is not nervous/anxious.   BREAST: No symptoms   Objective: There  were no vitals taken for this visit.   Physical Exam Constitutional:      Appearance: She is well-developed.  Genitourinary:     Vulva normal.     No vaginal discharge, erythema or tenderness.      Right Adnexa: not tender and no mass present.    Left Adnexa: not tender and no mass present.    No cervical polyp.     Uterus is not enlarged or tender.  Breasts:    Right: No mass, nipple discharge, skin change or tenderness.     Left: No mass, nipple discharge, skin change or tenderness.  Neck:     Thyroid: No thyromegaly.  Cardiovascular:     Rate  and Rhythm: Normal rate and regular rhythm.     Heart sounds: Normal heart sounds. No murmur heard. Pulmonary:     Effort: Pulmonary effort is normal.     Breath sounds: Normal breath sounds.  Abdominal:     Palpations: Abdomen is soft.     Tenderness: There is no abdominal tenderness. There is no guarding.  Musculoskeletal:        General: Normal range of motion.     Cervical back: Normal range of motion.  Neurological:     General: No focal deficit present.     Mental Status: She is alert and oriented to person, place, and time.     Cranial Nerves: No cranial nerve deficit.  Skin:    General: Skin is warm and dry.  Psychiatric:        Mood and Affect: Mood normal.        Behavior: Behavior normal.        Thought Content: Thought content normal.        Judgment: Judgment normal.  Vitals reviewed.    Assessment/Plan: No diagnosis found.            GYN counsel adequate intake of calcium and vitamin D, diet and exercise; Gardasil--f/u with PCP due to insurance     F/U  No follow-ups on file.  [name] B. Lataunya Ruud, PA-C 05/23/2023 5:02 PM

## 2023-05-24 ENCOUNTER — Encounter: Payer: Self-pay | Admitting: Obstetrics and Gynecology

## 2023-05-24 ENCOUNTER — Other Ambulatory Visit (HOSPITAL_COMMUNITY)
Admission: RE | Admit: 2023-05-24 | Discharge: 2023-05-24 | Disposition: A | Discharge status: Home / Self Care | Payer: Medicaid Other | Source: Ambulatory Visit | Attending: Obstetrics and Gynecology | Admitting: Obstetrics and Gynecology

## 2023-05-24 ENCOUNTER — Ambulatory Visit (INDEPENDENT_AMBULATORY_CARE_PROVIDER_SITE_OTHER): Payer: Medicaid Other | Admitting: Obstetrics and Gynecology

## 2023-05-24 VITALS — BP 102/70 | Ht 63.0 in | Wt 162.0 lb

## 2023-05-24 DIAGNOSIS — Z113 Encounter for screening for infections with a predominantly sexual mode of transmission: Secondary | ICD-10-CM

## 2023-05-24 DIAGNOSIS — A749 Chlamydial infection, unspecified: Secondary | ICD-10-CM | POA: Diagnosis present

## 2023-05-24 DIAGNOSIS — N921 Excessive and frequent menstruation with irregular cycle: Secondary | ICD-10-CM

## 2023-05-24 DIAGNOSIS — R1032 Left lower quadrant pain: Secondary | ICD-10-CM

## 2023-05-24 DIAGNOSIS — Z3202 Encounter for pregnancy test, result negative: Secondary | ICD-10-CM

## 2023-05-24 DIAGNOSIS — Z01419 Encounter for gynecological examination (general) (routine) without abnormal findings: Secondary | ICD-10-CM

## 2023-05-24 LAB — POCT URINE PREGNANCY: Preg Test, Ur: NEGATIVE

## 2023-05-24 NOTE — Patient Instructions (Signed)
I value your feedback and you entrusting us with your care. If you get a [facility] patient survey, I would appreciate you taking the time to let us know about your experience today. Thank you! ? ? ?

## 2023-05-25 LAB — HEPATITIS C ANTIBODY: Hep C Virus Ab: NONREACTIVE

## 2023-05-25 LAB — HIV ANTIBODY (ROUTINE TESTING W REFLEX): HIV Screen 4th Generation wRfx: NONREACTIVE

## 2023-05-25 LAB — RPR QUALITATIVE: RPR Ser Ql: NONREACTIVE

## 2023-05-26 ENCOUNTER — Encounter: Payer: Self-pay | Admitting: Obstetrics and Gynecology

## 2023-05-26 LAB — CERVICOVAGINAL ANCILLARY ONLY
Chlamydia: NEGATIVE
Comment: NEGATIVE
Comment: NEGATIVE
Comment: NORMAL
Neisseria Gonorrhea: NEGATIVE
Trichomonas: NEGATIVE

## 2023-06-17 ENCOUNTER — Ambulatory Visit
Admission: EM | Admit: 2023-06-17 | Discharge: 2023-06-17 | Disposition: A | Discharge status: Home / Self Care | Payer: Medicaid Other | Attending: Internal Medicine | Admitting: Internal Medicine

## 2023-06-17 DIAGNOSIS — L739 Follicular disorder, unspecified: Secondary | ICD-10-CM | POA: Diagnosis not present

## 2023-06-17 MED ORDER — SULFAMETHOXAZOLE-TRIMETHOPRIM 800-160 MG PO TABS: 1.0000 | ORAL_TABLET | Freq: Two times a day (BID) | ORAL | 0 refills | Status: AC

## 2023-06-17 NOTE — ED Triage Notes (Signed)
Patient presents to UC for cyst on vaginal area x 3 days. States drainage noted, cleaning with saline, applying A&D ointment. She reports shaving but changes razor each time.

## 2023-06-17 NOTE — ED Provider Notes (Signed)
[facility]    CSN: [id number] Arrival date & time: 06/17/23  1135      History   Chief Complaint Chief Complaint  Patient presents with   Cyst    HPI Mada Sadik is a 22 y.o. female presents for evaluation of a vaginal abscess. Pt reports she shaves the area and 3 days ago noticed a abscess to the left labia that began to drain today. Drainage was purulent per patient. Denies fevers or hx of MRSA. Has had vaginal abscesses in the past. Denies STD exposure or concern. Denies hx of HSV. She has been using neosporin to the area for symptoms. No other concerns at this time.   HPI  Past Medical History:  Diagnosis Date   Anemia     Patient Active Problem List   Diagnosis Date Noted   Chest pain 06/18/2015    Past Surgical History:  Procedure Laterality Date   NO PAST SURGERIES      OB History     Gravida  0   Para  0   Term  0   Preterm  0   AB  0   Living  0      SAB  0   IAB  0   Ectopic  0   Multiple  0   Live Births  0            Home Medications    Prior to Admission medications   Medication Sig Start Date End Date Taking? Authorizing Provider  sulfamethoxazole-trimethoprim (BACTRIM DS) 800-160 MG tablet Take 1 tablet by mouth 2 (two) times daily for 7 days. 06/17/23 06/24/23 Yes Radford Pax, NP    Family History Family History  Problem Relation Age of Onset   Stomach cancer Maternal Grandfather    Other Maternal Aunt        cx dysplasia   Breast cancer Neg Hx     Social History Social History   Tobacco Use   Smoking status: Never   Smokeless tobacco: Never  Vaping Use   Vaping status: Never Used  Substance Use Topics   Alcohol use: No   Drug use: Yes    Types: Marijuana     Allergies   Patient has no known allergies.   Review of Systems Review of Systems  Genitourinary:  Positive for genital sores.     Physical Exam Triage Vital Signs ED Triage Vitals  Encounter Vitals Group     BP 06/17/23  1236 124/64     Systolic BP Percentile --      Diastolic BP Percentile --      Pulse Rate 06/17/23 1236 75     Resp 06/17/23 1236 16     Temp 06/17/23 1236 98.4 F (36.9 C)     Temp Source 06/17/23 1236 Oral     SpO2 06/17/23 1236 100 %     Weight --      Height --      Head Circumference --      Peak Flow --      Pain Score 06/17/23 1235 4     Pain Loc --      Pain Education --      Exclude from Growth Chart --    No data found.  Updated Vital Signs BP 124/64 (BP Location: Left Arm)   Pulse 75   Temp 98.4 F (36.9 C) (Oral)   Resp 16   LMP 06/05/2023 (Exact Date)   SpO2 100%  Visual Acuity Right Eye Distance:   Left Eye Distance:   Bilateral Distance:    Right Eye Near:   Left Eye Near:    Bilateral Near:     Physical Exam Vitals and nursing note reviewed. Exam conducted with a chaperone present Dance movement psychotherapist).  Constitutional:      General: She is not in acute distress.    Appearance: Normal appearance. She is not ill-appearing.  HENT:     Head: Normocephalic and atraumatic.  Eyes:     Pupils: Pupils are equal, round, and reactive to light.  Cardiovascular:     Rate and Rhythm: Normal rate.  Pulmonary:     Effort: Pulmonary effort is normal.  Genitourinary:    Comments: Small mildly erythematous papules to right labia majora. No vesicles, lesions, drainage, induration, or fluctuance. Mildly TTP  Skin:    General: Skin is warm and dry.  Neurological:     General: No focal deficit present.     Mental Status: She is alert and oriented to person, place, and time.  Psychiatric:        Mood and Affect: Mood normal.        Behavior: Behavior normal.      UC Treatments / Results  Labs (all labs ordered are listed, but only abnormal results are displayed) Labs Reviewed - No data to display  EKG   Radiology No results found.  Procedures Procedures (including critical care time)  Medications Ordered in UC Medications - No data to  display  Initial Impression / Assessment and Plan / UC Course  I have reviewed the triage vital signs and the nursing notes.  Pertinent labs & imaging results that were available during my care of the patient were reviewed by me and considered in my medical decision making (see chart for details).     Reviewed exam and sx with patient. No red flags. Will treat for folliculitis with bactrim. Avoid having area until sx resolve. PCP follow up if symptoms do not improve. ER precautions reviewed.  Final Clinical Impressions(s) / UC Diagnoses   Final diagnoses:  Folliculitis     Discharge Instructions      Start Bactrim twice daily for 7 days  Warm compresses to the area as needed. No shaving the area until treatment is complete.  Follow up with your PCP if symptoms do not improve. Please go to the ER for any worsening symptoms. I hope you feel better soon!    ED Prescriptions     Medication Sig Dispense Auth. Provider   sulfamethoxazole-trimethoprim (BACTRIM DS) 800-160 MG tablet Take 1 tablet by mouth 2 (two) times daily for 7 days. 14 tablet Radford Pax, NP      PDMP not reviewed this encounter.   Radford Pax, NP 06/17/23 1255

## 2023-06-17 NOTE — Discharge Instructions (Signed)
Start Bactrim twice daily for 7 days  Warm compresses to the area as needed. No shaving the area until treatment is complete.  Follow up with your PCP if symptoms do not improve. Please go to the ER for any worsening symptoms. I hope you feel better soon!

## 2023-07-16 ENCOUNTER — Ambulatory Visit
Admission: EM | Admit: 2023-07-16 | Discharge: 2023-07-16 | Disposition: A | Discharge status: Home / Self Care | Payer: Medicaid Other | Attending: Emergency Medicine | Admitting: Emergency Medicine

## 2023-07-16 DIAGNOSIS — Z113 Encounter for screening for infections with a predominantly sexual mode of transmission: Secondary | ICD-10-CM

## 2023-07-16 DIAGNOSIS — R21 Rash and other nonspecific skin eruption: Secondary | ICD-10-CM | POA: Diagnosis not present

## 2023-07-16 MED ORDER — PREDNISONE 10 MG (21) PO TBPK: ORAL_TABLET | Freq: Every day | ORAL | 0 refills | Status: DC

## 2023-07-16 NOTE — Discharge Instructions (Signed)
Today you are being treated for the rash  Possibly related to use of Dial soap therefore stop as this is probably irritating the skin  take prednisone every morning with food as directed to help reduce the inflammatory process that occurs with this rash which will help minimize your itching as well as begin to clear  You may continue use of topical calamine or Benadryl cream to help manage itching, you may also continue oral Benadryl  Please avoid long exposures to heat such as a hot steamy shower or being outside as this may cause further irritation to your rash  You may follow-up with his urgent care as needed if symptoms persist or worsen   Labs pending, you will be contacted if positive for any sti and treatment will be sent to the pharmacy, you will have to return to the clinic if positive for gonorrhea to receive treatment   Please refrain from having sex until labs results, if positive please refrain from having sex until treatment complete and symptoms resolve   If positive for HIV, Syphilis, Chlamydia  gonorrhea or trichomoniasis please notify partner or partners so they may tested as well  Moving forward, it is recommended you use some form of protection against the transmission of sti infections  such as condoms or dental dams with each sexual encounter

## 2023-07-16 NOTE — ED Provider Notes (Signed)
[facility]    CSN: [id number] Arrival date & time: 07/16/23  1002      History   Chief Complaint Chief Complaint  Patient presents with   Rash   SEXUALLY TRANSMITTED DISEASE    HPI Kristi Herrera is a 22 y.o. female.    She presents for evaluation of erythematous pruritic rash present to the bilateral groin, left upper extremity and the bilateral ankles beginning 1 day ago.  Has attempted use of Aquaphor and Desitin cream with minimal improvement.  Began after use of a new soap 3 days ago, introduction of Dial antibacterial.  Denies drainage or fever.   Patient requesting routine STD testing.  Denies all symptoms, no known exposure.  Last menstrual period 06/29/2023.  No concern for pregnancy.    Past Medical History:  Diagnosis Date   Anemia     Patient Active Problem List   Diagnosis Date Noted   Chest pain 06/18/2015    Past Surgical History:  Procedure Laterality Date   NO PAST SURGERIES      OB History     Gravida  0   Para  0   Term  0   Preterm  0   AB  0   Living  0      SAB  0   IAB  0   Ectopic  0   Multiple  0   Live Births  0            Home Medications    Prior to Admission medications   Medication Sig Start Date End Date Taking? Authorizing Provider  predniSONE (STERAPRED UNI-PAK 21 TAB) 10 MG (21) TBPK tablet Take by mouth daily. Take 6 tabs by mouth daily  for 1 days, then 5 tabs for 1 days, then 4 tabs for 1 days, then 3 tabs for 1 days, 2 tabs for 1 days, then 1 tab by mouth daily for 1 days 07/16/23  Yes Jermaine Neuharth, Elita Boone, NP    Family History Family History  Problem Relation Age of Onset   Stomach cancer Maternal Grandfather    Other Maternal Aunt        cx dysplasia   Breast cancer Neg Hx     Social History Social History   Tobacco Use   Smoking status: Never   Smokeless tobacco: Never  Vaping Use   Vaping status: Never Used  Substance Use Topics   Alcohol use: No   Drug use: Yes     Types: Marijuana     Allergies   Patient has no known allergies.   Review of Systems Review of Systems  Constitutional: Negative.   Respiratory: Negative.    Cardiovascular: Negative.   Genitourinary: Negative.   Skin:  Positive for rash. Negative for color change, pallor and wound.  Neurological: Negative.      Physical Exam Triage Vital Signs ED Triage Vitals  Encounter Vitals Group     BP      Systolic BP Percentile      Diastolic BP Percentile      Pulse      Resp      Temp      Temp src      SpO2      Weight      Height      Head Circumference      Peak Flow      Pain Score      Pain Loc      Pain  Education      Exclude from Growth Chart    No data found.  Updated Vital Signs LMP 06/29/2023 (Approximate)   Visual Acuity Right Eye Distance:   Left Eye Distance:   Bilateral Distance:    Right Eye Near:   Left Eye Near:    Bilateral Near:     Physical Exam Constitutional:      Appearance: Normal appearance.  HENT:     Head: Normocephalic.  Eyes:     Extraocular Movements: Extraocular movements intact.  Genitourinary:    Comments: deferred Skin:    Comments: Fine erythematous papular rash present to the flexor of the left upper extremity, the medial aspects of the bilateral ankles and the medial aspects of the bilateral groin and draining, nontender  Neurological:     Mental Status: She is alert and oriented to person, place, and time. Mental status is at baseline.      UC Treatments / Results  Labs (all labs ordered are listed, but only abnormal results are displayed) Labs Reviewed  RPR  HIV ANTIBODY (ROUTINE TESTING W REFLEX)  CERVICOVAGINAL ANCILLARY ONLY    EKG   Radiology No results found.  Procedures Procedures (including critical care time)  Medications Ordered in UC Medications - No data to display  Initial Impression / Assessment and Plan / UC Course  I have reviewed the triage vital signs and the nursing  notes.  Pertinent labs & imaging results that were available during my care of the patient were reviewed by me and considered in my medical decision making (see chart for details).  Rash, routine screening for STI  Presentation of rash consistent with a contact dermatitis, most likely related to introduction of a new soap, advise discontinuation, declined steroid injection, prednisone taper sent to pharmacy, discussed administration, recommended topical antihistamines and steroids for management of pruritus and to soothe the skin, may follow-up if symptoms continue to persist or worsen, advised avoidance of long exposure to heat to prevent further irritation   STI labs pending will treat per protocol, advised abstinence until lab results, and/or treatment is complete, advised condom use during all sexual encounters moving, may follow-up with urgent care as needed  Final Clinical Impressions(s) / UC Diagnoses   Final diagnoses:  Routine screening for STI (sexually transmitted infection)  Rash and nonspecific skin eruption     Discharge Instructions      Today you are being treated for the rash  Possibly related to use of Dial soap therefore stop as this is probably irritating the skin  take prednisone every morning with food as directed to help reduce the inflammatory process that occurs with this rash which will help minimize your itching as well as begin to clear  You may continue use of topical calamine or Benadryl cream to help manage itching, you may also continue oral Benadryl  Please avoid long exposures to heat such as a hot steamy shower or being outside as this may cause further irritation to your rash  You may follow-up with his urgent care as needed if symptoms persist or worsen   Labs pending, you will be contacted if positive for any sti and treatment will be sent to the pharmacy, you will have to return to the clinic if positive for gonorrhea to receive treatment    Please refrain from having sex until labs results, if positive please refrain from having sex until treatment complete and symptoms resolve   If positive for HIV, Syphilis, Chlamydia  gonorrhea  or trichomoniasis please notify partner or partners so they may tested as well  Moving forward, it is recommended you use some form of protection against the transmission of sti infections  such as condoms or dental dams with each sexual encounter      ED Prescriptions     Medication Sig Dispense Auth. Provider   predniSONE (STERAPRED UNI-PAK 21 TAB) 10 MG (21) TBPK tablet Take by mouth daily. Take 6 tabs by mouth daily  for 1 days, then 5 tabs for 1 days, then 4 tabs for 1 days, then 3 tabs for 1 days, 2 tabs for 1 days, then 1 tab by mouth daily for 1 days 21 tablet Nasya Vincent, Elita Boone, NP      PDMP not reviewed this encounter.   Valinda Hoar, NP 07/16/23 1048

## 2023-07-16 NOTE — ED Triage Notes (Signed)
Patient presents to [facility] for STD screening and rash x 2 days. Applied Destin rash cream.

## 2023-07-18 LAB — CERVICOVAGINAL ANCILLARY ONLY
Bacterial Vaginitis (gardnerella): POSITIVE — AB
Candida Glabrata: NEGATIVE
Candida Vaginitis: NEGATIVE
Chlamydia: NEGATIVE
Comment: NEGATIVE
Comment: NEGATIVE
Comment: NEGATIVE
Comment: NEGATIVE
Comment: NEGATIVE
Comment: NORMAL
Neisseria Gonorrhea: NEGATIVE
Trichomonas: NEGATIVE

## 2023-07-19 ENCOUNTER — Telehealth (HOSPITAL_COMMUNITY): Payer: Self-pay | Admitting: Emergency Medicine

## 2023-07-19 LAB — RPR: RPR Ser Ql: NONREACTIVE

## 2023-07-19 LAB — HIV ANTIBODY (ROUTINE TESTING W REFLEX): HIV Screen 4th Generation wRfx: NONREACTIVE

## 2023-07-19 MED ORDER — METRONIDAZOLE 500 MG PO TABS: 500.0000 mg | ORAL_TABLET | Freq: Two times a day (BID) | ORAL | 0 refills | Status: DC

## 2023-09-27 ENCOUNTER — Ambulatory Visit: Payer: Medicaid Other

## 2023-09-27 ENCOUNTER — Other Ambulatory Visit (HOSPITAL_COMMUNITY)
Admission: RE | Admit: 2023-09-27 | Discharge: 2023-09-27 | Disposition: A | Discharge status: Home / Self Care | Payer: Medicaid Other | Source: Ambulatory Visit | Attending: Obstetrics and Gynecology | Admitting: Obstetrics and Gynecology

## 2023-09-27 VITALS — BP 118/62 | Ht 63.0 in | Wt 164.0 lb

## 2023-09-27 DIAGNOSIS — Z3202 Encounter for pregnancy test, result negative: Secondary | ICD-10-CM

## 2023-09-27 DIAGNOSIS — Z113 Encounter for screening for infections with a predominantly sexual mode of transmission: Secondary | ICD-10-CM

## 2023-09-27 DIAGNOSIS — Z7251 High risk heterosexual behavior: Secondary | ICD-10-CM

## 2023-09-27 LAB — POCT URINE PREGNANCY: Preg Test, Ur: NEGATIVE

## 2023-09-27 NOTE — Patient Instructions (Signed)
Trichomoniasis Trichomoniasis is a sexually transmitted infection (STI). Many people with trichomoniasis do not have any symptoms (are asymptomatic) or have only minimal symptoms. Untreated trichomoniasis can last from months to years. This condition is treated with medicine. What are the causes? This condition is caused by a parasite called Trichomonas vaginalis and is transmitted during sexual contact. What increases the risk? The following factors may make you more likely to develop this condition: Having unprotected sex. Having sex with a partner who has trichomoniasis. Having multiple sexual partners. Having had previous trichomoniasis infections or other STIs. What are the signs or symptoms? In females, symptoms of trichomoniasis include: Itching, burning, redness, or soreness in the genital area. Discomfort while urinating. Abnormal vaginal discharge that is clear, white, gray, or yellow-green and foamy and has an unusual fishy odor. In males, symptoms of trichomoniasis include: Discharge from the penis. Burning after urination or ejaculation. Itching or discomfort inside the penis. How is this diagnosed? This condition is diagnosed based on tests. To perform a test, your health care provider Geiger do one of the following: Ask you to provide a urine sample. Take a sample of discharge. The sample may be taken from the vagina or cervix in females and from the urethra in males. Your health care provider may use a swab to collect the sample. Your health care provider may test you for other STIs, including human immunodeficiency virus (HIV). How is this treated?  This condition is treated with medicines such as metronidazole or tinidazole. These are called antimicrobial medicines, and they are taken by mouth (orally). Your sexual partner or partners also need to be tested and treated. If they have the infection and are not treated, you Manfredi likely get reinfected. If you plan to become  pregnant or think you may be pregnant, tell your health care provider right away. Some medicines that are used to treat the infection should not be taken during pregnancy. Your health care provider may recommend over-the-counter medicines or creams to help relieve itching or irritation. You may be tested for the infection again 3 months after treatment. Follow these instructions at home: Medicines Take over-the-counter and prescription medicines only as told by your health care provider. Take your antimicrobial medicine as told by your health care provider. Do not stop taking it even if you start to feel better. Use creams as told by your health care provider. General instructions Do not have sex until after you finish your medicine and your symptoms have resolved. Do not wear tampons while you have the infection (if you are female). Talk with your sexual partner or partners about any symptoms that either of you may have, as well as any history of STIs. Keep all follow-up visits. This is important. How is this prevented?  Use condoms every time you have sex. Using condoms correctly and consistently can help protect against STIs. Do not have sexual contact if you have symptoms of trichomoniasis or another STI. Avoid having multiple sexual partners. Get tested for STIs before you have sex with a partner. Ask all partners to do the same. Do not douche (if you are female). Douching may increase your risk for getting STIs due to the removal of good bacteria in the vagina. Contact a health care provider if: You still have symptoms after you finish your medicine. You develop a rash. You plan to become pregnant or think you may be pregnant. Summary Trichomoniasis is a sexually transmitted infection (STI). This condition often has no symptoms or minimal   symptoms. Take your antimicrobial medicine as told by your health care provider. Do not stop taking even if you start to feel better. Discuss your  infection with your sexual partner or partners. Make sure that all partners get tested and treated, if necessary. You should not have sex until after you finish your medicine and your symptoms have resolved. Keep all follow-up visits. This is important. This information is not intended to replace advice given to you by your health care provider. Make sure you discuss any questions you have with your health care provider. Document Revised: 10/14/2021 Document Reviewed: 10/14/2021 Elsevier Patient Education  2024 [employer] Chlamydia, Female  Chlamydia is a sexually transmitted infection (STI). This infection spreads through sexual contact. Chlamydia can occur in different areas of the body, including: The urethra. This is the part of the body that drains urine from the bladder. The cervix. This is the lowest part of the uterus. The throat. The rectum. This condition is not difficult to treat. However, if left untreated, chlamydia can lead to more serious health problems, including pelvic inflammatory disease (PID). PID can increase your risk of being unable to have children. In pregnant women, untreated chlamydia can cause serious complications during pregnancy or health problems for the baby after delivery. What are the causes? This condition is caused by a bacteria called Chlamydia trachomatis. The bacteria are spread from an infected partner during sexual activity. Chlamydia can spread through contact with the genitals, mouth, or rectum. What increases the risk? The following factors may make you more likely to develop this condition: Not using a condom the right way or not using a condom every time you have sex. Having a new sex partner or having more than one sex partner. Being sexually active before age 70. What are the signs or symptoms? In some cases, there are no symptoms, especially early in the infection. If symptoms develop, they may include: Urinating often, or a burning  feeling during urination. Redness, soreness, or swelling of the vagina or rectum. Discharge coming from the vagina or rectum. Pain in the abdomen. Pain during sex. Bleeding between menstrual periods or irregular periods. How is this diagnosed? This condition may be diagnosed with: Urine tests. Swab tests. Depending on your symptoms, your health care provider may use a cotton swab to collect a fluid sample from your vagina, rectum, nose, or throat to test for the bacteria. A pelvic exam. How is this treated? This condition is treated with antibiotic medicines. Follow these instructions at home: Sexual activity Tell your sex partner or partners about your infection. These include any partners for oral, anal, or vaginal sex that you have had within 60 days of when your symptoms started. Sex partners should also be treated, even if they have no signs of the infection. Do not have sex until you and your sex partners have completed treatment and your health care provider says it is okay. If your health care provider prescribed you a single-dose medicine as treatment, wait at least 7 days after taking the medicine before having sex. General instructions Take over-the-counter and prescription medicines as told by your health care provider. Finish all antibiotic medicine even when you start to feel better. It is up to you to get your test results. Ask your health care provider, or the department that is doing the test, when your results will be ready. Keep all follow-up visits. This is important. You may need to be tested for infection again 3 months after treatment. How  is this prevented? You can lower your risk of getting chlamydia by: Using latex or polyurethane condoms correctly every time you have sex. Not having multiple sex partners. Asking if your sex partner has been tested for STIs and had negative results. Getting regular health screenings to check for STIs. Contact a health care  provider if: You develop new symptoms or your symptoms are getting worse. Your symptoms do not get better after treatment. You have a fever or chills. You have pain during sex. You have irregular menstrual periods, or you have bleeding between periods or after sex. You develop flu-like symptoms, such as night sweats, sore throat, or muscle aches. You are unable to take your antibiotic medicine as prescribed. Summary Chlamydia is a sexually transmitted infection (STI) that is caused by bacteria. This infection spreads through sexual contact. This condition is treated with antibiotic medicines. If left untreated, chlamydia can lead to more serious health problems, including pelvic inflammatory disease (PID). Your sex partners will also need to be treated. Do not have sex until both you and your partner have been treated. Take medicines as directed by your health care provider and keep all follow-up visits to ensure your infection has been completely treated. This information is not intended to replace advice given to you by your health care provider. Make sure you discuss any questions you have with your health care provider. Document Revised: 09/07/2021 Document Reviewed: 09/07/2021 Elsevier Patient Education  2024 [employer] Inc. Gonorrhea Gonorrhea is a sexually transmitted infection (STI) that can infect any person. If left untreated, this infection can: Damage the reproductive organs. Spread to other parts of the body. Cause someone to be unable to have children (infertility). Harm an unborn baby if an infected person is pregnant. It is important to get treatment for gonorrhea as soon as possible. All of your sex partners may also need to be treated for the infection. What are the causes? This condition is caused by bacteria called Neisseria gonorrhoeae. The infection is spread from person to person through sexual contact, including oral, anal, and vaginal sex. The infection can also pass  from a pregnant person to the baby during birth. What increases the risk? The following factors may make you more likely to develop this condition: Being a woman younger than 25 years and sexually active. Being a man who has sex with men. Having a new sex partner or having multiple partners. Having a sex partner who has an STI. Not using condoms correctly or not using condoms every time you have sex. Having a history of STIs. What are the signs or symptoms? When symptoms occur, they may include: Abnormal discharge from the penis or vagina. The discharge may be cloudy, thick, or yellow-green in color. Pain or burning when you urinate. Itching, irritation, pain, bleeding, or discharge from the rectum. This may occur if the infection was spread by anal sex. Sore throat or swollen lymph nodes in the neck. This may occur if the infection was spread by oral sex. Pain or swelling in the testicles. Bleeding between menstrual periods. If the infection has spread to other areas of the body, symptoms may include: Fever. Eye irritation. Swelling, redness, warmth, and pain in the joints. Rashes. In some cases, there are no symptoms. How is this diagnosed? This condition is diagnosed based on: A physical exam. A swab of fluid. The swab of fluid may be taken from the penis, vagina, throat, or rectum. Urine tests. Not all test results will be available during your  visit. How is this treated? This condition is treated with antibiotic medicines. It is important to start treatment as soon as possible. Early treatment may prevent some problems from developing. You should not have sex during treatment. All types of sexual activity should be avoided for at least 7 days after treatment is complete and until your sex partner or partners have been treated. Follow these instructions at home: Take over-the-counter and prescription medicines as told by your health care provider. Finish all antibiotic medicine  even when you start to feel better. Do not have sex during treatment. Do not have sex until at least 7 days after you and your partner or partners have finished treatment and your health care provider says it is okay. It is up to you to get your test results. Ask your health care provider, or the department that is doing the test, when your results will be ready. If you get a positive result on your gonorrhea test, tell your recent sex partners. These include any partners for oral, anal, or vaginal sex. They need to be checked for gonorrhea even if they do not have symptoms. They may need treatment, even if they get negative results on their gonorrhea tests. Keep all follow-up visits. This is important. How is this prevented?  Use latex or polyurethane condoms correctly every time you have sex. Ask if your sex partner or partners have been tested for STIs and had negative results. Avoid having multiple sex partners. Get regular health screenings to check for STIs. Contact a health care provider if: Your symptoms do not get better after a few days of taking antibiotics. Your symptoms get worse. You cannot take your medicine as directed by your health care provider. You develop new symptoms, including: Eye irritation. Swelling, redness, warmth, and pain in the joints. Rashes. You have a fever. Summary Gonorrhea is a sexually transmitted infection (STI) that can infect any person. This infection is spread from person to person through sexual contact, including oral, anal, and vaginal sex. The infection can also pass from a pregnant person to the baby during birth. Symptoms include abnormal discharge, pain or burning while urinating, or pain in the rectum. This condition is treated with antibiotic medicines. Do not have sex until at least 7 days after both you and any sex partners have completed antibiotic treatment. Tell your health care provider if you have trouble taking your medicine, your  symptoms get worse, or you have new symptoms. Keep all follow-up visits. This information is not intended to replace advice given to you by your health care provider. Make sure you discuss any questions you have with your health care provider. Document Revised: 10/08/2021 Document Reviewed: 10/08/2021 Elsevier Patient Education  2024 [employer].

## 2023-09-27 NOTE — Progress Notes (Signed)
    NURSE VISIT NOTE  Subjective:    Patient ID: Kristi Herrera, female    DOB: [birth year], 23 y.o.   MRN: [id number]  HPI  Patient is a 21 y.o. G0P0000 female who presents for a self swab nurse visit for STD testing. Patient has a new sex partner and would like to get tested. Denies abnormal vaginal bleeding or significant pelvic pain or fever. Denies UTI symptoms. Patient has history of known exposure to STD. Would like pregnancy test due to unprotected intercourse, patient thinks she might be pregnant. Last cycle was normal.   Objective:    BP 118/62   Ht 5\' 3"  (1.6 m)   Wt 164 lb (74.4 kg)   LMP 09/12/2023 (Exact Date)   BMI 29.05 kg/m     Assessment:   1. Screening for STD (sexually transmitted disease)   2. Unprotected sex      Plan:   Aptima sent to lab. Prengancy Test: Negative Treatment: Will wait for results to be treated if needed. ROV prn if symptoms persist or worsen.   Donnetta Hail, CMA

## 2023-09-29 LAB — CERVICOVAGINAL ANCILLARY ONLY
Bacterial Vaginitis (gardnerella): POSITIVE — AB
Candida Glabrata: NEGATIVE
Candida Vaginitis: NEGATIVE
Chlamydia: NEGATIVE
Comment: NEGATIVE
Comment: NEGATIVE
Comment: NEGATIVE
Comment: NEGATIVE
Comment: NEGATIVE
Comment: NORMAL
Neisseria Gonorrhea: NEGATIVE
Trichomonas: NEGATIVE

## 2023-09-29 MED ORDER — METRONIDAZOLE 500 MG PO TABS: ORAL_TABLET | ORAL | 0 refills | Status: DC

## 2023-09-29 NOTE — Addendum Note (Signed)
Addended by: Althea Grimmer B on: 09/29/2023 01:31 PM   Modules accepted: Orders

## 2024-04-25 ENCOUNTER — Ambulatory Visit: Admitting: Obstetrics

## 2024-04-26 ENCOUNTER — Ambulatory Visit (INDEPENDENT_AMBULATORY_CARE_PROVIDER_SITE_OTHER): Admitting: Obstetrics

## 2024-04-26 ENCOUNTER — Encounter: Payer: Self-pay | Admitting: Obstetrics

## 2024-04-26 ENCOUNTER — Other Ambulatory Visit (HOSPITAL_COMMUNITY)
Admission: RE | Admit: 2024-04-26 | Discharge: 2024-04-26 | Disposition: A | Discharge status: Home / Self Care | Source: Ambulatory Visit | Attending: Obstetrics | Admitting: Obstetrics

## 2024-04-26 VITALS — BP 130/81 | HR 85 | Ht 63.0 in | Wt 165.0 lb

## 2024-04-26 DIAGNOSIS — Z124 Encounter for screening for malignant neoplasm of cervix: Secondary | ICD-10-CM

## 2024-04-26 DIAGNOSIS — Z113 Encounter for screening for infections with a predominantly sexual mode of transmission: Secondary | ICD-10-CM | POA: Diagnosis present

## 2024-04-26 DIAGNOSIS — Z01419 Encounter for gynecological examination (general) (routine) without abnormal findings: Secondary | ICD-10-CM | POA: Diagnosis not present

## 2024-04-26 NOTE — Progress Notes (Signed)
 ANNUAL GYNECOLOGICAL EXAM  SUBJECTIVE  HPI  Kristi Herrera is a 23 y.o.-year-old G0P0000 who presents for an annual gynecological exam today.  She denies pelvic pain, dyspareunia, abnormal vaginal bleeding or discharge, and UTI symptoms. She has had ovarian cyst in the past but does not currently have any symptoms. She is feeling emotional about current challenges in her life but feels she is doing okay overall.  Medical/Surgical History Past Medical History:  Diagnosis Date   Anemia    Past Surgical History:  Procedure Laterality Date   NO PAST SURGERIES      Social History Lives with mother. Feels safe there Work: Surveyor, mining at [employer] Exercise: walking Substances: ~1.5 blunt daily, occasional EtOH. Denies tobacco, vape, and other recreational drugs  Obstetric History OB History     Gravida  0   Para  0   Term  0   Preterm  0   AB  0   Living  0      SAB  0   IAB  0   Ectopic  0   Multiple  0   Live Births  0            GYN/Menstrual History No LMP recorded (lmp unknown). Regular monthly periods Last Pap: Contraception:  Prevention Endorses regular eye and dental exams Mammogram: at 39 Colonoscopy: at 44 Flu shot/vaccines: considering HPV  Current Medications Outpatient Medications Prior to Visit  Medication Sig   [DISCONTINUED] metroNIDAZOLE  (FLAGYL ) 500 MG tablet Take 1 tab BID for 7 days; NO alcohol use for 10 days after prescription start   No facility-administered medications prior to visit.      [facility] - 04/26/24 1551       Pregnancy Intention Screening   Does the patient want to become pregnant in the next year? No    Does the patient's partner want to become pregnant in the next year? No    Would the patient like to discuss contraceptive options today? No      Contraception Wrap Up   Current Method No Method - Other Reason    Contraception Counseling Provided No    How was the end contraceptive method provided? N/A               ROS Constitutional: Denied constitutional symptoms, night sweats, recent illness, fatigue, fever, insomnia and weight loss.  Eyes: Denied eye symptoms, eye pain, photophobia, vision change and visual disturbance.  Ears/Nose/Throat/Neck: Denied ear, nose, throat or neck symptoms, hearing loss, nasal discharge, sinus congestion and sore throat.  Cardiovascular: Denied cardiovascular symptoms, arrhythmia, chest pain/pressure, edema, exercise intolerance, orthopnea and palpitations.  Respiratory: Denied pulmonary symptoms, asthma, pleuritic pain, productive sputum, cough, dyspnea and wheezing.  Gastrointestinal: Denied gastro-esophageal reflux, melena, nausea and vomiting.  Genitourinary: Denied genitourinary symptoms including symptomatic vaginal discharge, pelvic relaxation issues, and urinary complaints.  Musculoskeletal: Denied musculoskeletal symptoms, stiffness, swelling, muscle weakness and myalgia.  Dermatologic: Denied dermatology symptoms, rash and scar.  Neurologic: Denied neurology symptoms, dizziness, headache, neck pain and syncope.  Psychiatric: Denied psychiatric symptoms, anxiety and depression.  Endocrine: Denied endocrine symptoms including hot flashes and night sweats.    OBJECTIVE  BP 130/81   Pulse 85   Ht 5\' 3"  (1.6 m)   Wt 165 lb (74.8 kg)   LMP  (LMP Unknown)   BMI 29.23 kg/m    Physical examination General NAD, Conversant  HEENT Atraumatic; Op clear with mmm.  Normo-cephalic. Pupils reactive. Anicteric sclerae  Thyroid/Neck Smooth without nodularity  or enlargement. Normal ROM.  Neck Supple.  Skin No rashes, lesions or ulceration. Normal palpated skin turgor. No nodularity.  Breasts: No masses or discharge.  Symmetric.  No axillary adenopathy.  Lungs: Clear to auscultation.No rales or wheezes. Normal Respiratory effort, no retractions.  Heart: NSR.  No murmurs or rubs appreciated. No peripheral edema  Abdomen: Soft.  Non-tender.  No masses.   No HSM. No hernia  Extremities: Moves all appropriately.  Normal ROM for age. No lymphadenopathy.  Neuro: Oriented to PPT.  Normal mood. Normal affect.     Pelvic:   Vulva: Normal appearance.  No lesions.  Vagina: No lesions or abnormalities noted.  Support: Normal pelvic support.  Urethra No masses tenderness or scarring.  Meatus Normal size without lesions or prolapse.  Cervix: Normal appearance.  No lesions. Moderate amount of yellowish discharge. Pap collected.  Perineum: Normal exam.  No lesions.    ASSESSMENT  1) Annual exam 2) Due for Pap  PLAN 1) Physical exam as noted. Discussed healthy lifestyle choices and preventive care. STI testing and labs today: A1C, CBC, CMP, lipid panel, TSH. Offered referral for counseling but Genny declines at this time 2) Pap collected. F/u based on results  Return in one year for annual exam or as needed for concerns.   Tyarra Nolton, CNM

## 2024-04-28 ENCOUNTER — Other Ambulatory Visit: Payer: Self-pay | Admitting: Obstetrics

## 2024-04-28 ENCOUNTER — Ambulatory Visit: Payer: Self-pay | Admitting: Obstetrics

## 2024-04-28 DIAGNOSIS — B379 Candidiasis, unspecified: Secondary | ICD-10-CM

## 2024-04-28 DIAGNOSIS — D649 Anemia, unspecified: Secondary | ICD-10-CM | POA: Insufficient documentation

## 2024-04-28 LAB — HEP, RPR, HIV PANEL
HIV Screen 4th Generation wRfx: NONREACTIVE
Hepatitis B Surface Ag: NEGATIVE
RPR Ser Ql: NONREACTIVE

## 2024-04-28 LAB — CBC
Hematocrit: 33.3 % — ABNORMAL LOW (ref 34.0–46.6)
Hemoglobin: 9.4 g/dL — ABNORMAL LOW (ref 11.1–15.9)
MCH: 22.4 pg — ABNORMAL LOW (ref 26.6–33.0)
MCHC: 28.2 g/dL — ABNORMAL LOW (ref 31.5–35.7)
MCV: 80 fL (ref 79–97)
Platelets: 349 10*3/uL (ref 150–450)
RBC: 4.19 x10E6/uL (ref 3.77–5.28)
RDW: 16 % — ABNORMAL HIGH (ref 11.7–15.4)
WBC: 5.3 10*3/uL (ref 3.4–10.8)

## 2024-04-28 LAB — COMPREHENSIVE METABOLIC PANEL WITH GFR
ALT: 10 IU/L (ref 0–32)
AST: 18 IU/L (ref 0–40)
Albumin: 4.1 g/dL (ref 4.0–5.0)
Alkaline Phosphatase: 54 IU/L (ref 44–121)
BUN/Creatinine Ratio: 10 (ref 9–23)
BUN: 9 mg/dL (ref 6–20)
Bilirubin Total: 0.8 mg/dL (ref 0.0–1.2)
CO2: 16 mmol/L — ABNORMAL LOW (ref 20–29)
Calcium: 8.4 mg/dL — ABNORMAL LOW (ref 8.7–10.2)
Chloride: 106 mmol/L (ref 96–106)
Creatinine, Ser: 0.87 mg/dL (ref 0.57–1.00)
Globulin, Total: 3.1 g/dL (ref 1.5–4.5)
Glucose: 94 mg/dL (ref 70–99)
Potassium: 3.7 mmol/L (ref 3.5–5.2)
Sodium: 138 mmol/L (ref 134–144)
Total Protein: 7.2 g/dL (ref 6.0–8.5)
eGFR: 96 mL/min/{1.73_m2} (ref >59)

## 2024-04-28 LAB — LIPID PANEL
Chol/HDL Ratio: 3.3 ratio (ref 0.0–4.4)
Cholesterol, Total: 147 mg/dL (ref 100–199)
HDL: 44 mg/dL (ref >39)
LDL Chol Calc (NIH): 94 mg/dL (ref 0–99)
Triglycerides: 38 mg/dL (ref 0–149)
VLDL Cholesterol Cal: 9 mg/dL (ref 5–40)

## 2024-04-28 LAB — HEMOGLOBIN A1C
Est. average glucose Bld gHb Est-mCnc: 100 mg/dL
Hgb A1c MFr Bld: 5.1 % (ref 4.8–5.6)

## 2024-04-28 LAB — TSH: TSH: 1.15 u[IU]/mL (ref 0.450–4.500)

## 2024-04-28 MED ORDER — ACCRUFER 30 MG PO CAPS: 1.0000 | ORAL_CAPSULE | Freq: Two times a day (BID) | ORAL | 12 refills | Status: AC

## 2024-04-28 NOTE — Progress Notes (Signed)
 Rx sent for Accrufer  Almeter Westhoff M Dixon Luczak, CNM

## 2024-05-02 LAB — CYTOLOGY - PAP
Adequacy: ABSENT
Chlamydia: NEGATIVE
Comment: NEGATIVE
Comment: NEGATIVE
Comment: NEGATIVE
Comment: NORMAL
Diagnosis: UNDETERMINED — AB
High risk HPV: NEGATIVE
Neisseria Gonorrhea: NEGATIVE
Trichomonas: NEGATIVE

## 2024-05-04 MED ORDER — FLUCONAZOLE 150 MG PO TABS
150.0000 mg | ORAL_TABLET | Freq: Every day | ORAL | 0 refills | Status: AC
Start: 2024-05-04 — End: ?

## 2024-08-21 ENCOUNTER — Ambulatory Visit
Admission: EM | Admit: 2024-08-21 | Discharge: 2024-08-21 | Disposition: A | Discharge status: Home / Self Care | Attending: Emergency Medicine | Admitting: Emergency Medicine

## 2024-08-21 ENCOUNTER — Ambulatory Visit (INDEPENDENT_AMBULATORY_CARE_PROVIDER_SITE_OTHER)

## 2024-08-21 DIAGNOSIS — M25531 Pain in right wrist: Secondary | ICD-10-CM

## 2024-08-21 MED ORDER — PREDNISONE 10 MG (21) PO TBPK: ORAL_TABLET | Freq: Every day | ORAL | 0 refills | Status: DC

## 2024-08-21 NOTE — Discharge Instructions (Addendum)
 Today you are evaluated for your wrist pain  X-ray negative  Begin prednisone  every morning with food as directed to reduce inflammation and pain, avoid ibuprofen while taking but may use Tylenol   May apply ice or heat over the affected area in 10 to 15-minute intervals  May cushion with pillows whenever sitting and lying to help with any swelling and for general comfort  If there is an injury to the bone you will need to follow-up with orthopedics whose information is listed on parent page, call and schedule appointment  If there is no injury to the bone then you will need to follow-up if your symptoms do not improve with supportive care  May continue with activity when without pain with movement

## 2024-08-21 NOTE — ED Triage Notes (Signed)
 Patient to Urgent Care with complaints of right sided wrist pain (possible injury).  Symptoms x3 week. 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 States the repeatedly lifts these and it is worsening the pain.   No otc meds attempted.

## 2024-08-21 NOTE — ED Provider Notes (Signed)
 Kristi Herrera    CSN: [id number] Arrival date & time: 08/21/24  1542      History   Chief Complaint Chief Complaint  Patient presents with   Wrist Pain    HPI Kristi Herrera is a 23 y.o. female.   Patient presents for evaluation of right wrist pain beginning 3 weeks ago, exacerbated by movement.  Unsure of injury but endorses started new job which requires frequent lifting.  Denies numbness or tingling.  Has not attempted treatment.  Past Medical History:  Diagnosis Date   Anemia     Patient Active Problem List   Diagnosis Date Noted   Anemia 04/28/2024   Chest pain 06/18/2015    Past Surgical History:  Procedure Laterality Date   NO PAST SURGERIES      OB History     Gravida  0   Para  0   Term  0   Preterm  0   AB  0   Living  0      SAB  0   IAB  0   Ectopic  0   Multiple  0   Live Births  0            Home Medications    Prior to Admission medications   Medication Sig Start Date End Date Taking? Authorizing Provider  predniSONE  (STERAPRED UNI-PAK 21 TAB) 10 MG (21) TBPK tablet Take by mouth daily. Take 6 tabs by mouth daily  for 1 days, then 5 tabs for 1 days, then 4 tabs for 1 days, then 3 tabs for 1 days, 2 tabs for 1 days, then 1 tab by mouth daily for 1 days 08/21/24  Yes Lashante Fryberger R, NP  Ferric Maltol  (ACCRUFER ) 30 MG CAPS Take 1 capsule (30 mg total) by mouth 2 (two) times daily. Take one hour before a meal OR 2 hours after a meal. 04/28/24   Swanson, Eleanor M, CNM  fluconazole  (DIFLUCAN ) 150 MG tablet Take 1 tablet (150 mg total) by mouth daily. Patient not taking: Reported on 08/21/2024 05/04/24   Justino Eleanor HERO, CNM    Family History Family History  Problem Relation Age of Onset   Stomach cancer Maternal Grandfather    Other Maternal Aunt        cx dysplasia   Breast cancer Neg Hx     Social History Social History   Tobacco Use   Smoking status: Never   Smokeless tobacco: Never  Vaping Use    Vaping status: Never Used  Substance Use Topics   Alcohol use: No   Drug use: Yes    Types: Marijuana     Allergies   Patient has no known allergies.   Review of Systems Review of Systems   Physical Exam Triage Vital Signs ED Triage Vitals  Encounter Vitals Group     BP 08/21/24 1559 118/76     Girls Systolic BP Percentile --      Girls Diastolic BP Percentile --      Boys Systolic BP Percentile --      Boys Diastolic BP Percentile --      Pulse Rate 08/21/24 1559 70     Resp 08/21/24 1559 19     Temp 08/21/24 1559 97.7 F (36.5 C)     Temp src --      SpO2 08/21/24 1559 98 %     Weight --      Height --      Head Circumference --  Peak Flow --      Pain Score 08/21/24 1552 4     Pain Loc --      Pain Education --      Exclude from Growth Chart --    No data found.  Updated Vital Signs BP 118/76   Pulse 70   Temp 97.7 F (36.5 C)   Resp 19   LMP 08/13/2024   SpO2 98%   Visual Acuity Right Eye Distance:   Left Eye Distance:   Bilateral Distance:    Right Eye Near:   Left Eye Near:    Bilateral Near:     Physical Exam Constitutional:      Appearance: Normal appearance.  Eyes:     Extraocular Movements: Extraocular movements intact.  Pulmonary:     Effort: Pulmonary effort is normal.  Musculoskeletal:     Comments: Tenderness to the radial aspect of the right wrist extending to the forearm without ecchymosis swelling or deformity, no tenderness noted within the hand, able to complete full range of motion but pain is elicited with all movement of the wrist and movement of the right thumb, sensation intact, capillary refill less than 3, 2+ radial pulse, strength 4 out of 5  Neurological:     Mental Status: She is alert and oriented to person, place, and time.      UC Treatments / Results  Labs (all labs ordered are listed, but only abnormal results are displayed) Labs Reviewed - No data to display  EKG   Radiology DG Wrist Complete  Right Result Date: 08/21/2024 EXAM: 3 or more VIEW(S) XRAY OF THE RIGHT WRIST 08/21/2024 04:21:41 PM COMPARISON: None available. CLINICAL HISTORY: Pain x 3 weeks. Right wrist pain x 3 weeks. No known injury. 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 Limited ROM with some swelling. Pt states it feels like something is pulling. Pain more on the scaphoid/radial aspect of wrist. No previous fx or surgery. FINDINGS: BONES AND JOINTS: No acute fracture. No focal osseous lesion. No joint dislocation. SOFT TISSUES: The soft tissues are unremarkable. IMPRESSION: 1. No significant abnormality. Electronically signed by: Waddell Calk MD 08/21/2024 04:30 PM EDT RP Workstation: [id number]    Procedures Procedures (including critical care time)  Medications Ordered in UC Medications - No data to display  Initial Impression / Assessment and Plan / UC Course  I have reviewed the triage vital signs and the nursing notes.  Pertinent labs & imaging results that were available during my care of the patient were reviewed by me and considered in my medical decision making (see chart for details).  Acute pain of right wrist  X-ray negative reported to patient, prescribed prednisone  and discussed administration, etiology most likely irritation to the tendon and ligaments, additionally recommended RICE, heat massage stretching with activity as tolerated, for persisting symptoms advised to follow-up with orthopedics, information given Final Clinical Impressions(s) / UC Diagnoses   Final diagnoses:  Acute pain of right wrist     Discharge Instructions      Today you are evaluated for your wrist pain  X-ray negative  Begin prednisone  every morning with food as directed to reduce inflammation and pain, avoid ibuprofen while taking but may use Tylenol   May apply ice or heat over the affected area in 10 to 15-minute intervals  May cushion with pillows whenever sitting and lying to help with any  swelling and for general comfort  If there is an injury to the bone  you will need to follow-up with orthopedics whose information is listed on parent page, call and schedule appointment  If there is no injury to the bone then you will need to follow-up if your symptoms do not improve with supportive care  May continue with activity when without pain with movement   ED Prescriptions     Medication Sig Dispense Auth. Provider   predniSONE  (STERAPRED UNI-PAK 21 TAB) 10 MG (21) TBPK tablet Take by mouth daily. Take 6 tabs by mouth daily  for 1 days, then 5 tabs for 1 days, then 4 tabs for 1 days, then 3 tabs for 1 days, 2 tabs for 1 days, then 1 tab by mouth daily for 1 days 21 tablet Alilah Mcmeans, Shelba SAUNDERS, NP      PDMP not reviewed this encounter.   Teresa Shelba SAUNDERS, NP 08/21/24 1640

## 2024-09-05 ENCOUNTER — Ambulatory Visit: Admission: EM | Admit: 2024-09-05 | Discharge: 2024-09-05 | Discharge status: Against Medical Advice | Payer: Self-pay

## 2024-09-07 ENCOUNTER — Other Ambulatory Visit: Payer: Self-pay

## 2024-09-07 ENCOUNTER — Emergency Department: Payer: Self-pay

## 2024-09-07 ENCOUNTER — Encounter: Payer: Self-pay | Admitting: Emergency Medicine

## 2024-09-07 ENCOUNTER — Emergency Department
Admission: EM | Admit: 2024-09-07 | Discharge: 2024-09-07 | Disposition: A | Discharge status: Home / Self Care | Payer: Self-pay

## 2024-09-07 DIAGNOSIS — M654 Radial styloid tenosynovitis [de Quervain]: Secondary | ICD-10-CM | POA: Insufficient documentation

## 2024-09-07 MED ORDER — CYCLOBENZAPRINE HCL 10 MG PO TABS: 10.0000 mg | ORAL_TABLET | Freq: Three times a day (TID) | ORAL | 0 refills | Status: AC | PRN

## 2024-09-07 MED ORDER — PREDNISONE 50 MG PO TABS: 50.0000 mg | ORAL_TABLET | Freq: Every day | ORAL | 0 refills | Status: AC

## 2024-09-07 MED ORDER — NAPROXEN 500 MG PO TABS: 500.0000 mg | ORAL_TABLET | Freq: Two times a day (BID) | ORAL | Status: DC

## 2024-09-07 MED ORDER — NAPROXEN 500 MG PO TBEC: 500.0000 mg | DELAYED_RELEASE_TABLET | Freq: Two times a day (BID) | ORAL | 0 refills | Status: AC

## 2024-09-07 MED ORDER — PREDNISONE 20 MG PO TABS
60.0000 mg | ORAL_TABLET | Freq: Once | ORAL | Status: AC
  Administered 2024-09-07: 60 mg via ORAL
  Filled 2024-09-07: qty 3

## 2024-09-07 NOTE — ED Triage Notes (Signed)
 Pt arrives Pov, ambulatory to triage, gait steady, no acute distress noted c/o right wrist pain for 3 weeks. Pt states Wednesday she was moving something and heard her wrist crack and has been swollen and more painful since. Pt reports limited range of motion in right thumb. Pt wearing brace.

## 2024-09-07 NOTE — ED Provider Notes (Signed)
 [facility] Provider Note    Event Date/Time   First MD Initiated Contact with Patient 09/07/24 2038     (approximate)   History   Wrist Pain    HPI  Kristi Herrera is a 23 y.o. female    with a past medical history of pain of right wrist, folliculitis, gonorrhea, who presents to the ED complaining of wrist pain. According to the patient, symptoms started 3 weeks ago with right wrist pain.  This week patient was cleaning her car and here a crack on her wrist with subsequent edema.  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, picking up trays every day     Patient Active Problem List   Diagnosis Date Noted   Anemia 04/28/2024   Chest pain 06/18/2015     ROS: Patient currently denies any vision changes, tinnitus, difficulty speaking, facial droop, sore throat, chest pain, shortness of breath, abdominal pain, nausea/vomiting/diarrhea, dysuria, or weakness/numbness/paresthesias in any extremity   Physical Exam   Triage Vital Signs: ED Triage Vitals  Encounter Vitals Group     BP 09/07/24 2020 130/76     Girls Systolic BP Percentile --      Girls Diastolic BP Percentile --      Boys Systolic BP Percentile --      Boys Diastolic BP Percentile --      Pulse Rate 09/07/24 2020 87     Resp 09/07/24 2020 18     Temp 09/07/24 2020 97.8 F (36.6 C)     Temp Source 09/07/24 2020 Oral     SpO2 09/07/24 2020 100 %     Weight 09/07/24 2021 170 lb (77.1 kg)     Height 09/07/24 2021 5' 3 (1.6 m)     Head Circumference --      Peak Flow --      Pain Score 09/07/24 2020 5     Pain Loc --      Pain Education --      Exclude from Growth Chart --     Most recent vital signs: Vitals:   09/07/24 2020  BP: 130/76  Pulse: 87  Resp: 18  Temp: 97.8 F (36.6 C)  SpO2: 100%     Physical Exam Vitals and nursing note reviewed.   General:          Awake, no distress.  CV:                  Good peripheral perfusion.  Resp:               Normal  effort. no tachypnea Abd:                 No distention.  Soft nontender Other:              Right wrist: Skin is intact, edema in the volar area of the distal wrist, opposition of the thumb is limited.  Strength is decreased 4/5, tenderness to palpation in the first right finger at the metacarpal area.  Capillary refill less than 2 seconds.  Sensation is intact ED Results / Procedures / Treatments   Labs (all labs ordered are listed, but only abnormal results are displayed) Labs Reviewed - No data to display     PROCEDURES:  Critical Care performed:   Procedures   MEDICATIONS ORDERED IN ED: Medications  naproxen (NAPROSYN) tablet 500 mg (has no administration in time range)  predniSONE  (DELTASONE ) tablet 60 mg (  has no administration in time range)   Clinical Course as of 09/07/24 2135  Fri Sep 07, 2024  2113 DG Wrist Complete Right Negative. [AE]    Clinical Course User Index [AE] Janit Kast, PA-C    IMPRESSION / MDM / ASSESSMENT AND PLAN / ED COURSE  I reviewed the triage vital signs and the nursing notes.  Differential diagnosis includes, but is not limited to, de Quervain's tenosynovitis, Dupuytren tenosynovitis, fracture, dislocation  Patient's presentation is most consistent with acute complicated illness / injury requiring diagnostic workup.   Rashon Maines is a 23 y.o., female who presents today with history of right wrist pain.  On a physical exam tenderness to palpation at the base of the first metacarpal.  Opposition is limited by pain.  Edema in the distal volar area of the wrist.  Sensation is intact, postsurgical positive, strength 4/5. Plan Naproxen Prednisone  Brace Sling. Patient already is using a brace. Patient's diagnosis is consistent with de Quervain's tenosynovitis.  Due to her job patient is having wrist pain for overuse lifting trays. I independently reviewed and interpreted imaging and agree with radiologists findings ruling out fracture  or dislocation.  Did not order any labs, physical exam reassuring. I did review the patient's allergies and medications.The patient is in stable and satisfactory condition for discharge home  Patient will be discharged home with prescriptions for naproxen, prednisone , Flexeril . Patient is to follow up with orthopedics as needed or otherwise directed. Patient is given ED precautions to return to the ED for any worsening or new symptoms.  Work note was provided Discussed plan of care with patient, answered all of patient's questions, Patient agreeable to plan of care. Advised patient to take medications according to the instructions on the label. Discussed possible side effects of new medications. Patient verbalized understanding.  FINAL CLINICAL IMPRESSION(S) / ED DIAGNOSES   Final diagnoses:  De Quervain's tenosynovitis, right     Rx / DC Orders   ED Discharge Orders          Ordered    naproxen (EC NAPROSYN) 500 MG EC tablet  2 times daily with meals        09/07/24 2133    predniSONE  (DELTASONE ) 50 MG tablet  Daily with breakfast        09/07/24 2133    cyclobenzaprine  (FLEXERIL ) 10 MG tablet  3 times daily PRN        09/07/24 2135             Note:  This document was prepared using Dragon voice recognition software and may include unintentional dictation errors.   Janit Kast, PA-C 09/07/24 2135    Nicholaus Rolland BRAVO, MD 09/08/24 [phone]

## 2024-09-07 NOTE — Discharge Instructions (Addendum)
 You have been diagnosed with Quervain's tenosynovitis.  Please take naproxen 1 tablet every 12 hours after main meals.  Please take prednisone  1 tablet by mouth with breakfast for the next 3 days.  Please take Flexeril  1 tablet before bedtime.  You can call orthopedics and make an appointment for a follow-up.  Is use the brace and the sling.  Come back to ED or go to your PCP if you have new symptoms or symptoms worsen.

## 2024-09-07 NOTE — ED Notes (Signed)
 Pt completely assessed by EDP and set to discharge. No RN assessment performed.  Pt provided discharge instructions and prescription information. Pt was given the opportunity to ask questions and questions were answered.

## 2024-09-07 NOTE — ED Notes (Signed)
 Applied sling to right wrist at this time for support. Pt stated it felt comfortable with no complaints at this time.
